# Patient Record
Sex: Female | Born: 1980 | Race: White | Hispanic: No | Marital: Married | Smoking: Never smoker
Health system: Southern US, Community
[De-identification: ages and names within clinical notes are randomized; demographics above are authoritative.]

## PROBLEM LIST (undated history)

## (undated) DIAGNOSIS — T4145XA Adverse effect of unspecified anesthetic, initial encounter: Secondary | ICD-10-CM

## (undated) DIAGNOSIS — F909 Attention-deficit hyperactivity disorder, unspecified type: Secondary | ICD-10-CM

## (undated) DIAGNOSIS — E039 Hypothyroidism, unspecified: Secondary | ICD-10-CM

## (undated) DIAGNOSIS — C73 Malignant neoplasm of thyroid gland: Secondary | ICD-10-CM

## (undated) DIAGNOSIS — IMO0001 Reserved for inherently not codable concepts without codable children: Secondary | ICD-10-CM

## (undated) DIAGNOSIS — Q219 Congenital malformation of cardiac septum, unspecified: Secondary | ICD-10-CM

## (undated) DIAGNOSIS — T8859XA Other complications of anesthesia, initial encounter: Secondary | ICD-10-CM

## (undated) DIAGNOSIS — O24419 Gestational diabetes mellitus in pregnancy, unspecified control: Secondary | ICD-10-CM

## (undated) DIAGNOSIS — M419 Scoliosis, unspecified: Secondary | ICD-10-CM

## (undated) HISTORY — DX: Malignant neoplasm of thyroid gland: C73

## (undated) HISTORY — DX: Attention-deficit hyperactivity disorder, unspecified type: F90.9

## (undated) HISTORY — DX: Hypothyroidism, unspecified: E03.9

## (undated) HISTORY — DX: Adverse effect of unspecified anesthetic, initial encounter: T41.45XA

## (undated) HISTORY — DX: Scoliosis, unspecified: M41.9

## (undated) HISTORY — DX: Reserved for inherently not codable concepts without codable children: IMO0001

## (undated) HISTORY — PX: THYROIDECTOMY: SHX17

## (undated) HISTORY — DX: Congenital malformation of cardiac septum, unspecified: Q21.9

## (undated) HISTORY — DX: Gestational diabetes mellitus in pregnancy, unspecified control: O24.419

## (undated) HISTORY — PX: WISDOM TOOTH EXTRACTION: SHX21

## (undated) HISTORY — DX: Other complications of anesthesia, initial encounter: T88.59XA

---

## 2009-03-11 ENCOUNTER — Inpatient Hospital Stay (HOSPITAL_COMMUNITY): Admission: AD | Admit: 2009-03-11 | Discharge: 2009-03-11 | Payer: Self-pay | Admitting: Obstetrics and Gynecology

## 2009-03-13 ENCOUNTER — Inpatient Hospital Stay (HOSPITAL_COMMUNITY): Admission: AD | Admit: 2009-03-13 | Discharge: 2009-03-16 | Payer: Self-pay | Admitting: Obstetrics and Gynecology

## 2009-10-04 ENCOUNTER — Ambulatory Visit: Payer: Self-pay | Admitting: Family Medicine

## 2009-10-04 DIAGNOSIS — E039 Hypothyroidism, unspecified: Secondary | ICD-10-CM | POA: Insufficient documentation

## 2011-04-11 LAB — CBC
HCT: 40.6 % (ref 36.0–46.0)
MCHC: 34 g/dL (ref 30.0–36.0)
MCV: 94 fL (ref 78.0–100.0)
Platelets: 142 10*3/uL — ABNORMAL LOW (ref 150–400)
RBC: 3.05 MIL/uL — ABNORMAL LOW (ref 3.87–5.11)
RDW: 13.5 % (ref 11.5–15.5)
RDW: 13.7 % (ref 11.5–15.5)

## 2011-04-11 LAB — RPR: RPR Ser Ql: NONREACTIVE

## 2011-05-14 NOTE — Discharge Summary (Signed)
NAMECHARLCIE, Kathryn Dunn              ACCOUNT NO.:  [id number]   MEDICAL RECORD NO.:  [id number]          PATIENT TYPE:  INP   LOCATION:  9144                          FACILITY:  [facility]   PHYSICIAN:  Huel Cote, M.D. DATE OF BIRTH:  Sep 15, 1981   DATE OF ADMISSION:  03/13/2009  DATE OF DISCHARGE:  03/16/2009                               DISCHARGE SUMMARY   DISCHARGE DIAGNOSES:  1. Term pregnancy at 39-6/7th weeks' delivered.  2. Status post vacuum-assisted vaginal delivery.  3. Severe shoulder dystocia.   DISCHARGE MEDICATIONS:  1. Motrin 600 mg p.o. q.6 h.  2. Percocet 1-2 tablets p.o. q.4 h. p.r.n.   DISCHARGE FOLLOWUP:  The patient is to follow up in my office in 6 weeks  for her routine postpartum exam.   HOSPITAL COURSE:  The patient is a 30 year old, G1, P0, who was admitted  at 39-6/7th weeks' gestation with regular and painful contractions of  increasing intensity and cervical change to 2-3 cm.  Prenatal care was  uncomplicated overall.   PAST MEDICAL HISTORY:  Significant for thyroidectomy secondary to  thyroid cancer, ADHD, and hypoglycemia.   PAST SURGICAL HISTORY:  The thyroidectomy only.   PAST OBSTETRICAL HISTORY:  None.   PAST GYNECOLOGICAL HISTORY:  None.   SIGNIFICANT MEDICATIONS:  Synthroid, prenatal vitamins, and Atarax.   PRENATAL LABORATORY DATA:  A positive, antibody negative, RPR  nonreactive, rubella immune, hepatitis B surface antigen negative, HIV  negative, GC negative, and chlamydia negative.  One-hour Glucola was  elevated.  Three-hour Glucola was within normal limits.  The patient was  admitted with a reactive fetal heart rate.  Cervix was as stated 2-3 cm,  90, and -2 station.  She had rupture of membranes performed with clear  fluid noted and received an epidural.  She was augmented with Pitocin  and an IUPC placed to help adjust that.  She then continued to progress  well, reaching complete dilation at approximately 9:20 a.m. and began  pushing.  The patient pushed with several short breaks for approximately  2-1/2 hours and was making some progress, but visibly slowing down with  increasing fatigue.  We discussed options.  At this point, the vertex  with a +2-+3 station with head visible at the introitus and LOA in  presentation.  We discussed all options and the patient was agreeable to  a trial of vacuum-assisted vaginal delivery.  She had a vacuum placed at  a +2-+3 station and over 2 contractions the vertex was delivered to the  outlet.  The patient was allowed to continue to push without the vacuum  for about 10 minutes and could not get the head ultimately to crowning.  Therefore, the vacuum was reapplied, and head was delivered with 1  additional contraction.  There was a turtle sign noted to the NICU and  other nurses will call to the room immediately.  The severe shoulder  dystocia was encountered with McRoberts suprapubic and Woods-Screw  maneuver ineffective.  A posterior arm release was then performed of the  right arm and then the anterior left shoulder was delivered without  significant  traction.  Cord was clamped and the baby was handed to the  pediatrician slightly floppy, but with a good heart rate.  Apgars were 6  and 8, weight was 9 pounds and 9 ounces of a viable female infant.  After delivery, the baby responded very quickly to stimulation and was  moving both arms, though the left was slightly less than the right.  Placenta delivered spontaneously.  A third-degree laceration was noted.  The rectal sphincter was repaired in an overlapping fashion with several  figure-of-eight 2-0 Vicryl sutures and the remainder of the repair was  done with 3-0 Vicryl Rapide.  Cervix and rectum were intact.  Methergine  IM x1 was given for atony and total estimated blood loss was 500 mL.  Postpartum, the patient did well.  Her hemoglobin after delivery was  6.9.  Pain was controlled with p.o. medications and she was  advised on  stool softeners and placed on those postpartum.  She will continue on  with her pain medications, stool softeners, and be aggressive about her  bowel regimen to ensure no significant constipation.  Fundus was firm  and bleeding normal on discharge, and she was to follow up in the office  in 6 weeks.      Huel Cote, M.D.  Electronically Signed     KR/MEDQ  D:  03/16/2009  T:  03/16/2009  Job:  [id number]

## 2011-12-31 NOTE — L&D Delivery Note (Signed)
Delivery Note At 1:32 PM a viable female was delivered via Vaginal, Spontaneous Delivery (Presentation: Left Occiput Anterior).  APGAR: 9, 9; weight 7#7oz .   Placenta status: Intact, Spontaneous.  Cord: 3 vessels with the following complications: None.  Anesthesia: Epidural  Episiotomy: None Lacerations: 2nd degree Suture Repair: 3.0 vicryl rapide Est. Blood Loss (mL): 400  Mom to postpartum.  Baby to stay with mom in room.  Kathryn Dunn 08/04/2012, 2:21 PM

## 2012-01-01 LAB — OB RESULTS CONSOLE GC/CHLAMYDIA: Gonorrhea: NEGATIVE

## 2012-01-01 LAB — OB RESULTS CONSOLE ANTIBODY SCREEN: Antibody Screen: NEGATIVE

## 2012-01-01 LAB — OB RESULTS CONSOLE ABO/RH

## 2012-07-17 LAB — OB RESULTS CONSOLE GBS: GBS: NEGATIVE

## 2012-07-27 ENCOUNTER — Encounter (HOSPITAL_COMMUNITY): Payer: Self-pay | Admitting: *Deleted

## 2012-07-27 ENCOUNTER — Telehealth (HOSPITAL_COMMUNITY): Payer: Self-pay | Admitting: *Deleted

## 2012-07-27 NOTE — Telephone Encounter (Signed)
Preadmission screen  

## 2012-08-04 ENCOUNTER — Inpatient Hospital Stay (HOSPITAL_COMMUNITY)
Admission: AD | Admit: 2012-08-04 | Discharge: 2012-08-06 | DRG: 372 | Disposition: A | Discharge status: Home / Self Care | Payer: BC Managed Care – PPO | Source: Ambulatory Visit | Attending: Obstetrics and Gynecology | Admitting: Obstetrics and Gynecology

## 2012-08-04 ENCOUNTER — Encounter (HOSPITAL_COMMUNITY): Payer: Self-pay | Admitting: Anesthesiology

## 2012-08-04 ENCOUNTER — Inpatient Hospital Stay (HOSPITAL_COMMUNITY): Payer: BC Managed Care – PPO | Admitting: Anesthesiology

## 2012-08-04 ENCOUNTER — Encounter (HOSPITAL_COMMUNITY): Payer: Self-pay | Admitting: *Deleted

## 2012-08-04 DIAGNOSIS — O4290 Premature rupture of membranes, unspecified as to length of time between rupture and onset of labor, unspecified weeks of gestation: Principal | ICD-10-CM | POA: Diagnosis present

## 2012-08-04 LAB — CBC
Platelets: 164 10*3/uL (ref 150–400)
RBC: 4.17 MIL/uL (ref 3.87–5.11)
WBC: 10.6 10*3/uL — ABNORMAL HIGH (ref 4.0–10.5)

## 2012-08-04 LAB — RPR: RPR Ser Ql: NONREACTIVE

## 2012-08-04 MED ORDER — OXYCODONE-ACETAMINOPHEN 5-325 MG PO TABS: 1.0000 | ORAL_TABLET | ORAL | Status: DC | PRN

## 2012-08-04 MED ORDER — ZOLPIDEM TARTRATE 5 MG PO TABS: 5.0000 mg | ORAL_TABLET | Freq: Every evening | ORAL | Status: DC | PRN

## 2012-08-04 MED ORDER — EPHEDRINE 5 MG/ML INJ
10.0000 mg | INTRAVENOUS | Status: DC | PRN
  Filled 2012-08-04: qty 4

## 2012-08-04 MED ORDER — LIDOCAINE HCL (PF) 1 % IJ SOLN
INTRAMUSCULAR | Status: DC | PRN
  Administered 2012-08-04 (×2): 4 mL

## 2012-08-04 MED ORDER — DIPHENHYDRAMINE HCL 25 MG PO CAPS: 25.0000 mg | ORAL_CAPSULE | Freq: Four times a day (QID) | ORAL | Status: DC | PRN

## 2012-08-04 MED ORDER — IBUPROFEN 600 MG PO TABS: 600.0000 mg | ORAL_TABLET | Freq: Four times a day (QID) | ORAL | Status: DC | PRN

## 2012-08-04 MED ORDER — BENZOCAINE-MENTHOL 20-0.5 % EX AERO
1.0000 "application " | INHALATION_SPRAY | CUTANEOUS | Status: DC | PRN
  Administered 2012-08-04: 1 via TOPICAL
  Filled 2012-08-04: qty 56

## 2012-08-04 MED ORDER — PRENATAL MULTIVITAMIN CH
1.0000 | ORAL_TABLET | Freq: Every day | ORAL | Status: DC
  Administered 2012-08-04 – 2012-08-06 (×2): 1 via ORAL
  Filled 2012-08-04 (×3): qty 1

## 2012-08-04 MED ORDER — SENNOSIDES-DOCUSATE SODIUM 8.6-50 MG PO TABS
2.0000 | ORAL_TABLET | Freq: Every day | ORAL | Status: DC
  Administered 2012-08-04 – 2012-08-05 (×2): 2 via ORAL

## 2012-08-04 MED ORDER — LACTATED RINGERS IV SOLN: 500.0000 mL | Freq: Once | INTRAVENOUS | Status: DC

## 2012-08-04 MED ORDER — LIDOCAINE HCL (PF) 1 % IJ SOLN
30.0000 mL | INTRAMUSCULAR | Status: DC | PRN
  Filled 2012-08-04: qty 30

## 2012-08-04 MED ORDER — OXYTOCIN BOLUS FROM INFUSION
250.0000 mL | Freq: Once | INTRAVENOUS | Status: DC
  Filled 2012-08-04: qty 500

## 2012-08-04 MED ORDER — ONDANSETRON HCL 4 MG/2ML IJ SOLN: 4.0000 mg | Freq: Four times a day (QID) | INTRAMUSCULAR | Status: DC | PRN

## 2012-08-04 MED ORDER — OXYCODONE-ACETAMINOPHEN 5-325 MG PO TABS
1.0000 | ORAL_TABLET | ORAL | Status: DC | PRN
  Administered 2012-08-05 – 2012-08-06 (×6): 1 via ORAL
  Filled 2012-08-04 (×6): qty 1

## 2012-08-04 MED ORDER — DIBUCAINE 1 % RE OINT: 1.0000 "application " | TOPICAL_OINTMENT | RECTAL | Status: DC | PRN

## 2012-08-04 MED ORDER — LACTATED RINGERS IV SOLN
INTRAVENOUS | Status: DC
  Administered 2012-08-04: 05:00:00 via INTRAVENOUS

## 2012-08-04 MED ORDER — OXYTOCIN 40 UNITS IN LACTATED RINGERS INFUSION - SIMPLE MED
1.0000 m[IU]/min | INTRAVENOUS | Status: DC
  Administered 2012-08-04: 1 m[IU]/min via INTRAVENOUS

## 2012-08-04 MED ORDER — IBUPROFEN 600 MG PO TABS
600.0000 mg | ORAL_TABLET | Freq: Four times a day (QID) | ORAL | Status: DC
  Administered 2012-08-04 – 2012-08-06 (×8): 600 mg via ORAL
  Filled 2012-08-04 (×8): qty 1

## 2012-08-04 MED ORDER — ACETAMINOPHEN 325 MG PO TABS: 650.0000 mg | ORAL_TABLET | ORAL | Status: DC | PRN

## 2012-08-04 MED ORDER — ONDANSETRON HCL 4 MG/2ML IJ SOLN: 4.0000 mg | INTRAMUSCULAR | Status: DC | PRN

## 2012-08-04 MED ORDER — EPHEDRINE 5 MG/ML INJ: 10.0000 mg | INTRAVENOUS | Status: DC | PRN

## 2012-08-04 MED ORDER — LANOLIN HYDROUS EX OINT: TOPICAL_OINTMENT | CUTANEOUS | Status: DC | PRN

## 2012-08-04 MED ORDER — PHENYLEPHRINE 40 MCG/ML (10ML) SYRINGE FOR IV PUSH (FOR BLOOD PRESSURE SUPPORT)
80.0000 ug | PREFILLED_SYRINGE | INTRAVENOUS | Status: DC | PRN
  Filled 2012-08-04: qty 5

## 2012-08-04 MED ORDER — TERBUTALINE SULFATE 1 MG/ML IJ SOLN: 0.2500 mg | Freq: Once | INTRAMUSCULAR | Status: DC | PRN

## 2012-08-04 MED ORDER — CITRIC ACID-SODIUM CITRATE 334-500 MG/5ML PO SOLN: 30.0000 mL | ORAL | Status: DC | PRN

## 2012-08-04 MED ORDER — WITCH HAZEL-GLYCERIN EX PADS: 1.0000 "application " | MEDICATED_PAD | CUTANEOUS | Status: DC | PRN

## 2012-08-04 MED ORDER — DIPHENHYDRAMINE HCL 50 MG/ML IJ SOLN: 12.5000 mg | INTRAMUSCULAR | Status: DC | PRN

## 2012-08-04 MED ORDER — FENTANYL 2.5 MCG/ML BUPIVACAINE 1/10 % EPIDURAL INFUSION (WH - ANES)
INTRAMUSCULAR | Status: DC | PRN
  Administered 2012-08-04: 14 mL/h via EPIDURAL

## 2012-08-04 MED ORDER — LACTATED RINGERS IV SOLN
500.0000 mL | INTRAVENOUS | Status: DC | PRN
  Administered 2012-08-04: 1000 mL via INTRAVENOUS

## 2012-08-04 MED ORDER — PHENYLEPHRINE 40 MCG/ML (10ML) SYRINGE FOR IV PUSH (FOR BLOOD PRESSURE SUPPORT): 80.0000 ug | PREFILLED_SYRINGE | INTRAVENOUS | Status: DC | PRN

## 2012-08-04 MED ORDER — TETANUS-DIPHTH-ACELL PERTUSSIS 5-2.5-18.5 LF-MCG/0.5 IM SUSP
0.5000 mL | Freq: Once | INTRAMUSCULAR | Status: AC
  Administered 2012-08-05: 0.5 mL via INTRAMUSCULAR
  Filled 2012-08-04: qty 0.5

## 2012-08-04 MED ORDER — FENTANYL 2.5 MCG/ML BUPIVACAINE 1/10 % EPIDURAL INFUSION (WH - ANES)
14.0000 mL/h | INTRAMUSCULAR | Status: DC
  Filled 2012-08-04: qty 60

## 2012-08-04 MED ORDER — ONDANSETRON HCL 4 MG PO TABS: 4.0000 mg | ORAL_TABLET | ORAL | Status: DC | PRN

## 2012-08-04 MED ORDER — SIMETHICONE 80 MG PO CHEW: 80.0000 mg | CHEWABLE_TABLET | ORAL | Status: DC | PRN

## 2012-08-04 MED ORDER — FLEET ENEMA 7-19 GM/118ML RE ENEM: 1.0000 | ENEMA | RECTAL | Status: DC | PRN

## 2012-08-04 MED ORDER — OXYTOCIN 40 UNITS IN LACTATED RINGERS INFUSION - SIMPLE MED
62.5000 mL/h | Freq: Once | INTRAVENOUS | Status: DC
  Filled 2012-08-04: qty 1000

## 2012-08-04 MED ORDER — LEVOTHYROXINE SODIUM 150 MCG PO TABS
150.0000 ug | ORAL_TABLET | Freq: Every day | ORAL | Status: DC
  Administered 2012-08-05 – 2012-08-06 (×2): 150 ug via ORAL
  Filled 2012-08-04 (×3): qty 1

## 2012-08-04 NOTE — MAU Note (Signed)
Pt reports ROM at 0230, clear fluid. Some contractions

## 2012-08-04 NOTE — Anesthesia Procedure Notes (Signed)
Epidural Patient location during procedure: OB Start time: 08/04/2012 11:05 AM  Staffing Anesthesiologist: Laxmi Choung A. Performed by: anesthesiologist   Preanesthetic Checklist Completed: patient identified, site marked, surgical consent, pre-op evaluation, timeout performed, IV checked, risks and benefits discussed and monitors and equipment checked  Epidural Patient position: sitting Prep: site prepped and draped and DuraPrep Patient monitoring: continuous pulse ox and blood pressure Approach: midline Injection technique: LOR air  Needle:  Needle type: Tuohy  Needle gauge: 17 G Needle length: 9 cm Needle insertion depth: 5 cm cm Catheter type: closed end flexible Catheter size: 19 Gauge Catheter at skin depth: 10 cm Test dose: negative and Other  Assessment Events: blood not aspirated, injection not painful, no injection resistance, negative IV test and no paresthesia  Additional Notes Patient identified. Risks and benefits discussed including failed block, incomplete  Pain control, post dural puncture headache, nerve damage, paralysis, blood pressure Changes, nausea, vomiting, reactions to medications-both toxic and allergic and post Partum back pain. All questions were answered. Patient expressed understanding and wished to proceed. Sterile technique was used throughout procedure. Epidural site was Dressed with sterile barrier dressing. No paresthesias, signs of intravascular injection Or signs of intrathecal spread were encountered.  Patient was more comfortable after the epidural was dosed. Please see RN's note for documentation of vital signs and FHR which are stable.

## 2012-08-04 NOTE — Progress Notes (Signed)
Pt up to bathroom via stedy. After voiding pt states she was feeling dizzy.  Pulled emergency cord.  Help to room as pt was fainting.  Easily aroused with ammonia.  Pt brought back to bed via stedy.  Vital signs 117/76, 67.  Alert, oriented x4.  Lochia small with no clots.  Uterus 2 below and firm.  Husband at bedside.  Pt eating crackers and drinking apple juice.  Will continue to monitor closely

## 2012-08-04 NOTE — Progress Notes (Signed)
   Subjective: Pt admitted with SROM, not feeling significant contractions  Objective: BP 105/64  Pulse 71  Temp 98.3 F (36.8 C) (Oral)  Resp 18  Ht 5\' 5"  (1.651 m)  Wt 81.194 kg (179 lb)  BMI 29.79 kg/m2  SpO2 99%  LMP 11/06/2011      FHT:  FHR: 130 bpm, variability: moderate,  accelerations:  Present,  decelerations:  Absent UC:   regular, every 3-5 minutes SVE:   Dilation: 3 Effacement (%): 60 Station: -2 Exam by:: Dr. Senaida Ores No siginificant forebag noted  Labs: Lab Results  Component Value Date   WBC 10.6* 08/04/2012   HGB 13.1 08/04/2012   HCT 38.0 08/04/2012   MCV 91.1 08/04/2012   PLT 164 08/04/2012    Assessment / Plan: SROM with no labor, will start pitocin Kathryn Dunn 08/04/2012, 8:59 AM

## 2012-08-04 NOTE — Progress Notes (Signed)
Pt. To room 113 via wheelchair.  Report given to [facility]

## 2012-08-04 NOTE — H&P (Signed)
NAMEGALEN, Kathryn Dunn              ACCOUNT NO.:  [id number]  MEDICAL RECORD NO.:  [id number]  LOCATION:  9168                          FACILITY:  [facility]  PHYSICIAN:  Malachi Pro. Ambrose Mantle, M.D. DATE OF BIRTH:  1981-01-04  DATE OF ADMISSION:  08/04/2012 DATE OF DISCHARGE:                             HISTORY & PHYSICAL   PRESENT ILLNESS:  This is a 31 year old white female, para 1-0-0-1, gravida 2, EDC August 12, 2012, who is admitted with premature rupture of the membranes.  Blood group and type A positive, negative antibody. Pap smear normal.  Rubella immune.  RPR nonreactive.  Urine culture negative.  Hepatitis B surface antigen negative, HIV negative.  GC and Chlamydia negative.  First trimester screen negative.  Cystic fibrosis screen declined.  AFP negative.  Early 1 hour Glucola 123.  Later 1 hour Glucola 176, 3 hour GTT 81, 178, 115, and 99.  Group B strep negative. This patient had a relatively benign prenatal course.  She was scheduled for induction in 3 days.  At 2:00 a.m. today, she had ruptured her membranes, came to the hospital, was contracting without pain and was admitted to the hospital.  She had an early ultrasound at 7 weeks and 4 days on December 27, 2011, that gave her a due date of August 10, 2012. By dates, she had a due date of August 12, 2012, a repeat ultrasound on March 19, 2012, average gestational age was 20 weeks 1 day with an [facility] of August 05, 2012.  No abnormalities were noted.  PAST MEDICAL HISTORY:  Reveals no known allergies.  She did have history of ADHD.  She has had a history of gestational diabetes.  She had acquired hypothyroidism.  She did have thyroid cancer in 2005.  She has had PUPPS, scoliosis.  SURGICAL HISTORY:  Total thyroidectomy in 2005, wisdom teeth extracted 2011.  MEDICATIONS:  Prenatal vitamins and Synthroid 137 mcg a day.  FAMILY MEDICAL HISTORY:  Father and brother with ADHD maternal grandmother with breast cancer.  Father with high  blood pressure, and paternal grandfather with hypertension, paternal grandmother with kidney disease.  The patient denies tobacco, alcohol, and illicit substance abuse.  She has a Education administrator degree in special education from [employer].  OBSTETRIC HISTORY:  The patient had 9 pounds 9 ounce female infant, delivered in 2010 by Dr. Senaida Ores by vacuum extraction.  There was shoulder dystocia, and third-degree laceration.  PHYSICAL EXAM:  VITAL SIGNS:  Blood pressure is 110/67, pulse is 66, respirations 20, temperature 98.3. HEART:  Normal size and sounds.  No murmurs.  LUNGS:  Clear to auscultation. ABDOMEN:  Soft and not tender.  Contractions are coming every 3 minutes. The fundal height at her last prenatal visit on July 27, 2012, was 38 cm.  Fetal heart tones are normal with good acceleration and no deceleration.  Per the admitting nurse the cervix is 3 cm.  Rupture of membranes was confirmed.  ADMITTING IMPRESSION:  Intrauterine pregnancy at 38 weeks and 6 days, with premature rupture of the membranes.  The patient is admitted. Decision on Pitocin augmentation will be based on whether this progress without Pitocin.     Malachi Pro.  Ambrose Mantle, M.D.     TFH/MEDQ  D:  08/04/2012  T:  08/04/2012  Job:  [id number]

## 2012-08-04 NOTE — Anesthesia Preprocedure Evaluation (Signed)
Anesthesia Evaluation    Airway Mallampati: II TM Distance: >3 FB Neck ROM: Full    Dental No notable dental hx. (+) Teeth Intact   Pulmonary neg pulmonary ROS,  breath sounds clear to auscultation  Pulmonary exam normal       Cardiovascular Rhythm:Regular Rate:Normal  Hx/o ASD   Neuro/Psych PSYCHIATRIC DISORDERS ADHDnegative neurological ROS     GI/Hepatic negative GI ROS, Neg liver ROS,   Endo/Other  Well Controlled, GestationalHypothyroidism   Renal/GU negative Renal ROS  negative genitourinary   Musculoskeletal negative musculoskeletal ROS (+)   Abdominal   Peds  Hematology negative hematology ROS (+)   Anesthesia Other Findings   Reproductive/Obstetrics (+) Pregnancy                           Anesthesia Physical Anesthesia Plan  ASA: II  Anesthesia Plan: Epidural   Post-op Pain Management:    Induction:   Airway Management Planned: Natural Airway  Additional Equipment:   Intra-op Plan:   Post-operative Plan:   Informed Consent: I have reviewed the patients History and Physical, chart, labs and discussed the procedure including the risks, benefits and alternatives for the proposed anesthesia with the patient or authorized representative who has indicated his/her understanding and acceptance.   Dental advisory given  Plan Discussed with: Anesthesiologist  Anesthesia Plan Comments:         Anesthesia Quick Evaluation

## 2012-08-05 LAB — CBC
HCT: 36.9 % (ref 36.0–46.0)
Hemoglobin: 12.5 g/dL (ref 12.0–15.0)
MCHC: 33.9 g/dL (ref 30.0–36.0)
RBC: 3.99 MIL/uL (ref 3.87–5.11)
WBC: 10.2 10*3/uL (ref 4.0–10.5)

## 2012-08-05 NOTE — Anesthesia Postprocedure Evaluation (Signed)
  Anesthesia Post-op Note  Patient: Kathryn Dunn  Procedure(s) Performed: * No procedures listed *  Patient Location: PACU and Mother/Baby  Anesthesia Type: Epidural  Level of Consciousness: awake, alert  and oriented  Airway and Oxygen Therapy: Patient Spontanous Breathing  Post-op Pain: none  Post-op Assessment: Post-op Vital signs reviewed  Post-op Vital Signs: Reviewed and stable  Complications: No apparent anesthesia complications

## 2012-08-05 NOTE — Progress Notes (Signed)
Post Partum Day 1 Subjective: no complaints, up ad lib and tolerating PO  Objective: Blood pressure 109/74, pulse 64, temperature 98.1 F (36.7 C), temperature source Oral, resp. rate 16, height 5\' 5"  (1.651 m), weight 81.194 kg (179 lb), last menstrual period 11/06/2011, SpO2 98.00%, unknown if currently breastfeeding.  Physical Exam:  General: alert and cooperative Lochia: appropriate Uterine Fundus: firm    Basename 08/05/12 0600 08/04/12 0510  HGB 12.5 13.1  HCT 36.9 38.0    Assessment/Plan: Plan for discharge tomorrow D/w pt and husband circumcision risks and benefits, desires to proceed.   LOS: 1 day   Kaytlin Burklow W 08/05/2012, 6:28 AM

## 2012-08-06 MED ORDER — OXYCODONE-ACETAMINOPHEN 5-325 MG PO TABS: 1.0000 | ORAL_TABLET | ORAL | Status: AC | PRN

## 2012-08-06 MED ORDER — IBUPROFEN 600 MG PO TABS: 600.0000 mg | ORAL_TABLET | Freq: Four times a day (QID) | ORAL | Status: AC

## 2012-08-06 NOTE — Discharge Summary (Signed)
Obstetric Discharge Summary Reason for Admission: induction of labor Prenatal Procedures: none Intrapartum Procedures: spontaneous vaginal delivery Postpartum Procedures: none Complications-Operative and Postpartum: second degree perineal laceration Hemoglobin  Date Value Range Status  08/05/2012 12.5  12.0 - 15.0 g/dL Final     HCT  Date Value Range Status  08/05/2012 36.9  36.0 - 46.0 % Final    Physical Exam:  General: alert and cooperative Lochia: appropriate Uterine Fundus: firm   Discharge Diagnoses: Term Pregnancy-delivered  Discharge Information: Date: 08/06/2012 Activity: pelvic rest Diet: routine Medications: Ibuprofen and Percocet Condition: improved Instructions: refer to practice specific booklet Discharge to: home   Newborn Data: Live born female  Birth Weight: 7 lb 7 oz (3374 g) APGAR: 9, 9  Home with mother.  Kathryn Dunn 08/06/2012, 8:49 AM

## 2012-08-06 NOTE — Progress Notes (Signed)
Post Partum Day 2 Subjective: no complaints, up ad lib and tolerating PO  Objective: Blood pressure 93/60, pulse 64, temperature 97.7 F (36.5 C), temperature source Oral, resp. rate 18, height 5\' 5"  (1.651 m), weight 81.194 kg (179 lb), last menstrual period 11/06/2011, SpO2 93.00%, unknown if currently breastfeeding.  Physical Exam:  General: alert and cooperative Lochia: appropriate Uterine Fundus: firm    Basename 08/05/12 0600 08/04/12 0510  HGB 12.5 13.1  HCT 36.9 38.0    Assessment/Plan: Discharge home Motrin, percocet F/u 6 weeks   LOS: 2 days   Sylva Overley W 08/06/2012, 8:48 AM

## 2012-08-07 ENCOUNTER — Inpatient Hospital Stay (HOSPITAL_COMMUNITY): Admission: RE | Admit: 2012-08-07 | Payer: BC Managed Care – PPO | Source: Ambulatory Visit

## 2013-04-25 ENCOUNTER — Encounter: Payer: Self-pay | Admitting: Emergency Medicine

## 2013-04-25 ENCOUNTER — Emergency Department (INDEPENDENT_AMBULATORY_CARE_PROVIDER_SITE_OTHER)
Admission: EM | Admit: 2013-04-25 | Discharge: 2013-04-25 | Disposition: A | Discharge status: Home / Self Care | Payer: BC Managed Care – PPO | Source: Home / Self Care | Attending: Emergency Medicine | Admitting: Emergency Medicine

## 2013-04-25 DIAGNOSIS — J0100 Acute maxillary sinusitis, unspecified: Secondary | ICD-10-CM

## 2013-04-25 MED ORDER — AMOXICILLIN 875 MG PO TABS: 875.0000 mg | ORAL_TABLET | Freq: Two times a day (BID) | ORAL | Status: DC

## 2013-04-25 NOTE — ED Provider Notes (Signed)
History     CSN: [id number]  Arrival date & time 04/25/13  1616   First MD Initiated Contact with Patient 04/25/13 1645      Chief Complaint  Patient presents with  . Nasal Congestion    (Consider location/radiation/quality/duration/timing/severity/associated sxs/prior treatment) HPI Kathryn Dunn is a 32 y.o. female who complains of onset of cold symptoms for 5 days.  The symptoms are constant and mild-moderate in severity. No sore throat + cough No pleuritic pain No wheezing + nasal congestion + post-nasal drainage ++ sinus pain/pressure (main symptom) No chest congestion No itchy/red eyes No earache No hemoptysis No chills/sweats No fever No nausea No vomiting No abdominal pain No diarrhea No skin rashes No fatigue No myalgias + headache    Past Medical History  Diagnosis Date  . ADHD (attention deficit hyperactivity disorder)   . Gestational diabetes     diet controlled  . 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 Thyroid cancer     thyroid removed  . Hypothyroidism   . Scoliosis   . Complication of anesthesia     vomitting during surgery  . Patient is a currently breast-feeding mother     Past Surgical History  Procedure Laterality Date  . Thyroidectomy    . Wisdom tooth extraction    . Thyroidectomy      Family History  Problem Relation Age of Onset  . ADD / ADHD Father   . Hypertension Father   . ADD / ADHD Brother   . Thyroid cancer Maternal Uncle   . Cancer Maternal Grandmother     breast  . Kidney disease Paternal Grandmother   . Hypertension Paternal Grandfather   . Cancer Cousin     colon  . Mental retardation Cousin     History  Substance Use Topics  . Smoking status: Never Smoker   . Smokeless tobacco: Never Used  . Alcohol Use: No    OB History   Grav Para Term Preterm Abortions TAB SAB Ect Mult Living   2 2 2  0 0 0 0 0 0 2      Review of Systems  All other systems reviewed and are  negative.    Allergies  Review of patient's allergies indicates no known allergies.  Home Medications   Current Outpatient Rx  Name  Route  Sig  Dispense  Refill  . amoxicillin (AMOXIL) 875 MG tablet   Oral   Take 1 tablet (875 mg total) by mouth 2 (two) times daily.   14 tablet   0   . docusate sodium (COLACE) 100 MG capsule   Oral   Take 100 mg by mouth daily.         Marland Kitchen levothyroxine (SYNTHROID, LEVOTHROID) 150 MCG tablet   Oral   Take 150 mcg by mouth daily.         . Prenatal Vit-Fe Fumarate-FA (PRENATAL MULTIVITAMIN) TABS   Oral   Take 1 tablet by mouth daily.           BP 100/67  Pulse 90  Temp(Src) 99.6 F (37.6 C) (Oral)  Ht 5\' 5"  (1.651 m)  Wt 140 lb (63.504 kg)  BMI 23.3 kg/m2  SpO2 97%  Physical Exam  Nursing note and vitals reviewed. Constitutional: She is oriented to person, place, and time. She appears well-developed and well-nourished.  HENT:  Head: Normocephalic and atraumatic.  Right Ear: Tympanic membrane, external ear and ear canal normal.  Left Ear: Tympanic membrane,  external ear and ear canal normal.  Nose: Mucosal edema and rhinorrhea present. Right sinus exhibits maxillary sinus tenderness. Left sinus exhibits maxillary sinus tenderness.  Mouth/Throat: Posterior oropharyngeal erythema present. No oropharyngeal exudate or posterior oropharyngeal edema.  Eyes: No scleral icterus.  Neck: Neck supple.  Cardiovascular: Regular rhythm and normal heart sounds.   Pulmonary/Chest: Effort normal and breath sounds normal. No respiratory distress.  Neurological: She is alert and oriented to person, place, and time.  Skin: Skin is warm and dry.  Psychiatric: She has a normal mood and affect. Her speech is normal.    ED Course  Procedures (including critical care time)  Labs Reviewed - No data to display No results found.   1. Acute maxillary sinusitis       MDM  1)  Take the prescribed antibiotic as instructed. 2)  Use nasal  saline solution (over the counter) at least 3 times a day. 3)  Use over the counter decongestants like Zyrtec-D every 12 hours as needed to help with congestion.  If you have hypertension, do not take medicines with sudafed.  4)  Can take tylenol every 6 hours or motrin every 8 hours for pain or fever. 5)  Follow up with your primary doctor if no improvement in 5-7 days, sooner if increasing pain, fever, or new symptoms.     Marlaine Hind, MD 04/25/13 1700

## 2013-04-25 NOTE — ED Notes (Signed)
Reports congestion with cough and headache x 5 days. Took Advil at 15:30 today.

## 2014-10-31 ENCOUNTER — Encounter: Payer: Self-pay | Admitting: Emergency Medicine

## 2015-01-12 ENCOUNTER — Encounter: Payer: Self-pay | Admitting: Emergency Medicine

## 2015-01-12 ENCOUNTER — Emergency Department
Admission: EM | Admit: 2015-01-12 | Discharge: 2015-01-12 | Disposition: A | Discharge status: Home / Self Care | Payer: BC Managed Care – PPO | Source: Home / Self Care | Attending: Emergency Medicine | Admitting: Emergency Medicine

## 2015-01-12 DIAGNOSIS — M7032 Other bursitis of elbow, left elbow: Secondary | ICD-10-CM

## 2015-01-12 DIAGNOSIS — M7022 Olecranon bursitis, left elbow: Secondary | ICD-10-CM

## 2015-01-12 MED ORDER — HYDROCODONE-ACETAMINOPHEN 5-325 MG PO TABS: 2.0000 | ORAL_TABLET | ORAL | Status: DC | PRN

## 2015-01-12 MED ORDER — IBUPROFEN 800 MG PO TABS: 800.0000 mg | ORAL_TABLET | Freq: Three times a day (TID) | ORAL | Status: DC

## 2015-01-12 NOTE — Discharge Instructions (Signed)
Bursitis °Bursitis is a swelling and soreness (inflammation) of a fluid-filled sac (bursa) that overlies and protects a joint. It can be caused by injury, overuse of the joint, arthritis or infection. The joints most likely to be affected are the elbows, shoulders, hips and knees. °HOME CARE INSTRUCTIONS  °· Apply ice to the affected area for 15-20 minutes each hour while awake for 2 days. Put the ice in a plastic bag and place a towel between the bag of ice and your skin. °· Rest the injured joint as much as possible, but continue to put the joint through a full range of motion, 4 times per day. (The shoulder joint especially becomes rapidly "frozen" if not used.) When the pain lessens, begin normal slow movements and usual activities. °· Only take over-the-counter or prescription medicines for pain, discomfort or fever as directed by your caregiver. °· Your caregiver may recommend draining the bursa and injecting medicine into the bursa. This may help the healing process. °· Follow all instructions for follow-up with your caregiver. This includes any orthopedic referrals, physical therapy and rehabilitation. Any delay in obtaining necessary care could result in a delay or failure of the bursitis to heal and chronic pain. °SEEK IMMEDIATE MEDICAL CARE IF:  °· Your pain increases even during treatment. °· You develop an oral temperature above 102° F (38.9° C) and have heat and inflammation over the involved bursa. °MAKE SURE YOU:  °· Understand these instructions. °· Will watch your condition. °· Will get help right away if you are not doing well or get worse. °Document Released: 12/13/2000 Document Revised: 03/09/2012 Document Reviewed: 03/07/2014 °[employer]® Patient Information ©2015 [employer]. This information is not intended to replace advice given to you by your health care provider. Make sure you discuss any questions you have with your health care provider. ° °

## 2015-01-12 NOTE — ED Provider Notes (Signed)
CSN: [id number]     Arrival date & time 01/12/15  0806 History   First MD Initiated Contact with Patient 01/12/15 [phone]     Chief Complaint  Patient presents with  . Elbow Pain   (Consider location/radiation/quality/duration/timing/severity/associated sxs/prior Treatment) Patient is a 34 y.o. female presenting with arm injury. The history is provided by the patient. No language interpreter was used.  Arm Injury Location:  Elbow Injury: no   Elbow location:  L elbow Pain details:    Quality:  Aching   Radiates to:  Does not radiate   Severity:  Moderate   Onset quality:  Gradual   Duration:  1 day   Timing:  Constant   Progression:  Worsening Chronicity:  New Prior injury to area:  Yes (years ago) Worsened by:  Movement Ineffective treatments:  None tried Associated symptoms: no fever     Past Medical History  Diagnosis Date  . ADHD (attention deficit hyperactivity disorder)   . Gestational diabetes     diet controlled  . 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 Thyroid cancer     thyroid removed  . Hypothyroidism   . Scoliosis   . Complication of anesthesia     vomitting during surgery  . Patient is a currently breast-feeding mother    Past Surgical History  Procedure Laterality Date  . Thyroidectomy    . Wisdom tooth extraction    . Thyroidectomy     Family History  Problem Relation Age of Onset  . ADD / ADHD Father   . Hypertension Father   . ADD / ADHD Brother   . Thyroid cancer Maternal Uncle   . Cancer Maternal Grandmother     breast  . Kidney disease Paternal Grandmother   . Hypertension Paternal Grandfather   . Cancer Cousin     colon  . Mental retardation Cousin    History  Substance Use Topics  . Smoking status: Never Smoker   . Smokeless tobacco: Never Used  . Alcohol Use: No   OB History    Gravida Para Term Preterm AB TAB SAB Ectopic Multiple Living   2 2 2  0 0 0 0 0 0 2     Review of Systems  Constitutional:  Negative for fever.  Musculoskeletal: Positive for myalgias.  All other systems reviewed and are negative.   Allergies  Review of patient's allergies indicates no known allergies.  Home Medications   Prior to Admission medications   Medication Sig Start Date End Date Taking? Authorizing Provider  amoxicillin (AMOXIL) 875 MG tablet Take 1 tablet (875 mg total) by mouth 2 (two) times daily. 04/25/13   Janeann Forehand, MD  docusate sodium (COLACE) 100 MG capsule Take 100 mg by mouth daily.    Historical Provider, MD  levothyroxine (SYNTHROID, LEVOTHROID) 150 MCG tablet Take 150 mcg by mouth daily.    Historical Provider, MD  Prenatal Vit-Fe Fumarate-FA (PRENATAL MULTIVITAMIN) TABS Take 1 tablet by mouth daily.    Historical Provider, MD   BP 107/70 mmHg  Pulse 74  Temp(Src) 98.2 F (36.8 C) (Oral)  Resp 16  Ht 5\' 4"  (1.626 m)  Wt 140 lb (63.504 kg)  BMI 24.02 kg/m2  SpO2 100%  LMP 01/12/2015 Physical Exam  Constitutional: She is oriented to person, place, and time. She appears well-developed and well-nourished.  Cardiovascular: Normal rate.   Musculoskeletal: She exhibits tenderness.  Swollen red left elbow bursa,  Pain with range of  motion  Neurological: She is alert and oriented to person, place, and time. She has normal reflexes.  Skin: Skin is warm.  Psychiatric: She has a normal mood and affect.  Nursing note and vitals reviewed.   ED Course  Procedures (including critical care time) Labs Review Labs Reviewed - No data to display  Imaging Review No results found.   MDM   1. Bursitis of elbow, left    Ibuprofen Hydrocodone Sling See Dr. Darene Lamer for recheck AVS    Fransico Meadow, PA-C 01/12/15 [phone]

## 2015-01-12 NOTE — ED Notes (Signed)
Patient reports onset of left elbow pain yesterday that progressed to severe pain by nightfall. Pain worse with movement; feels warm to touch; somewhat reddened. Has taken ibuprofen without relief. Denies any injury.

## 2015-01-16 ENCOUNTER — Ambulatory Visit (INDEPENDENT_AMBULATORY_CARE_PROVIDER_SITE_OTHER): Payer: BC Managed Care – PPO

## 2015-01-16 ENCOUNTER — Encounter: Payer: Self-pay | Admitting: Sports Medicine

## 2015-01-16 ENCOUNTER — Ambulatory Visit (INDEPENDENT_AMBULATORY_CARE_PROVIDER_SITE_OTHER): Payer: BC Managed Care – PPO | Admitting: Sports Medicine

## 2015-01-16 VITALS — BP 105/66 | HR 67 | Ht 64.0 in | Wt 142.0 lb

## 2015-01-16 DIAGNOSIS — L03119 Cellulitis of unspecified part of limb: Secondary | ICD-10-CM | POA: Insufficient documentation

## 2015-01-16 DIAGNOSIS — M25422 Effusion, left elbow: Secondary | ICD-10-CM

## 2015-01-16 MED ORDER — FLUCONAZOLE 150 MG PO TABS: 150.0000 mg | ORAL_TABLET | Freq: Once | ORAL | Status: DC

## 2015-01-16 MED ORDER — DOXYCYCLINE HYCLATE 100 MG PO TABS: 100.0000 mg | ORAL_TABLET | Freq: Two times a day (BID) | ORAL | Status: AC

## 2015-01-16 NOTE — Assessment & Plan Note (Signed)
Bedside ultrasound not showing any collections, I did see diffuse edema in the subcutaneous tissue, elbow joint was not effused. X-rays, doxycycline.  Return in one week.

## 2015-01-16 NOTE — Progress Notes (Signed)
   Subjective:    I'm seeing this patient as a consultation for:  Marcene Brawn PA-C  CC: Left elbow swelling  HPI: This is a very pleasant 34 year old female, for the past week she's noted increasing pain and swelling of her left elbow with significant erythema extending midway down the forearm, and several inches proximal to the olecranon. She denies any trauma, no constitutional symptoms, no change in activity. She was seen in urgent care, and referred to me for further evaluation and definitive treatment. Since she was seen in urgent care she has been doing well with occasional anti-inflammatory, symptoms are mildly improving.  Past medical history, Surgical history, Family history not pertinant except as noted below, Social history, Allergies, and medications have been entered into the medical record, reviewed, and no changes needed.   Review of Systems: No headache, visual changes, nausea, vomiting, diarrhea, constipation, dizziness, abdominal pain, skin rash, fevers, chills, night sweats, weight loss, swollen lymph nodes, body aches, joint swelling, muscle aches, chest pain, shortness of breath, mood changes, visual or auditory hallucinations.   Objective:   General: Well Developed, well nourished, and in no acute distress.  Neuro/Psych: Alert and oriented x3, extra-ocular muscles intact, able to move all 4 extremities, sensation grossly intact. Skin: Warm and dry, no rashes noted.  Respiratory: Not using accessory muscles, speaking in full sentences, trachea midline.  Cardiovascular: Pulses palpable, no extremity edema. Abdomen: Does not appear distended. Left Elbow: There is full as an swelling with erythema, mild tenderness, and mild induration over the posterior elbow. There is no ballotability suggesting no olecranon bursitis. Range of motion full pronation, supination, flexion, extension. Strength is full to all of the above directions Stable to varus, valgus stress. Negative  moving valgus stress test. No discrete areas of tenderness to palpation. Ulnar nerve does not sublux. Negative cubital tunnel Tinel's.  Unofficial bedside ultrasound was performed showing edema in the subcutaneous tissues but no collection over the olecranon. Diagnosis is cellulitis.  Impression and Recommendations:   This case required medical decision making of moderate complexity.

## 2015-01-31 ENCOUNTER — Ambulatory Visit: Payer: BC Managed Care – PPO | Admitting: Sports Medicine

## 2015-12-21 LAB — OB RESULTS CONSOLE HIV ANTIBODY (ROUTINE TESTING)
HIV: NONREACTIVE
HIV: NONREACTIVE
HIV: NONREACTIVE

## 2015-12-21 LAB — OB RESULTS CONSOLE GC/CHLAMYDIA
Chlamydia: NEGATIVE
GC PROBE AMP, GENITAL: NEGATIVE

## 2015-12-21 LAB — OB RESULTS CONSOLE RPR: RPR: NONREACTIVE

## 2015-12-31 NOTE — L&D Delivery Note (Signed)
Delivery Note Pt reached complete dilation and pushed about an hour.  After delivery of the head the posterior right arm was in a compound presentation and was delivered followed by the anterior shoulder. At 4:16 PM a healthy female was delivered via Vaginal, Spontaneous Delivery (Presentation: Right Occiput Anterior).  APGAR: 9, 9; weight pending .   Placenta status: Intact, Spontaneous.  Cord: 3 vessels with the following complications: None.   Anesthesia: Epidural  Episiotomy: None Lacerations: 2nd degree Suture Repair: 3.0 vicryl rapide Est. Blood Loss (mL):  220ml  Mom to postpartum.  Baby to Couplet care / Skin to Skin. D/w pt and husband circumcision and they desire to proceed.  Logan Bores 07/02/2016, 4:44 PM

## 2016-05-13 ENCOUNTER — Encounter: Payer: BC Managed Care – PPO | Attending: Obstetrics and Gynecology | Admitting: Skilled Nursing Facility1

## 2016-05-13 VITALS — Ht 64.0 in | Wt 164.0 lb

## 2016-05-13 DIAGNOSIS — Z3A30 30 weeks gestation of pregnancy: Secondary | ICD-10-CM | POA: Insufficient documentation

## 2016-05-13 DIAGNOSIS — O24410 Gestational diabetes mellitus in pregnancy, diet controlled: Secondary | ICD-10-CM | POA: Diagnosis present

## 2016-05-15 ENCOUNTER — Encounter: Payer: Self-pay | Admitting: Skilled Nursing Facility1

## 2016-05-15 NOTE — Progress Notes (Signed)
  Patient was seen on 05/15/2016 for Gestational Diabetes self-management class at the [facility]. The following learning objectives were met by the patient during this course:   States the definition of Gestational Diabetes  States why dietary management is important in controlling blood glucose  Describes the effects each nutrient has on blood glucose levels  Demonstrates ability to create a balanced meal plan  Demonstrates carbohydrate counting   States when to check blood glucose levels  Demonstrates proper blood glucose monitoring techniques  States the effect of stress and exercise on blood glucose levels  States the importance of limiting caffeine and abstaining from alcohol and smoking  Blood glucose monitor given: One Touch Verio Flex Lot # [id number] X Exp: 02/2017 Blood glucose reading: 80  Patient instructed to monitor glucose levels: FBS: 60 - <90 1 hour: <140 2 hour: <120  *Patient received handouts:  Nutrition Diabetes and Pregnancy  Carbohydrate Counting List  Patient will be seen for follow-up as needed.

## 2016-06-18 LAB — OB RESULTS CONSOLE GBS: STREP GROUP B AG: NEGATIVE

## 2016-07-02 ENCOUNTER — Inpatient Hospital Stay (HOSPITAL_COMMUNITY): Payer: BC Managed Care – PPO | Admitting: Anesthesiology

## 2016-07-02 ENCOUNTER — Encounter (HOSPITAL_COMMUNITY): Payer: Self-pay

## 2016-07-02 ENCOUNTER — Inpatient Hospital Stay (HOSPITAL_COMMUNITY)
Admission: AD | Admit: 2016-07-02 | Discharge: 2016-07-04 | DRG: 775 | Disposition: A | Discharge status: Home / Self Care | Payer: BC Managed Care – PPO | Source: Ambulatory Visit | Attending: Obstetrics and Gynecology | Admitting: Obstetrics and Gynecology

## 2016-07-02 DIAGNOSIS — Z810 Family history of intellectual disabilities: Secondary | ICD-10-CM

## 2016-07-02 DIAGNOSIS — Z8249 Family history of ischemic heart disease and other diseases of the circulatory system: Secondary | ICD-10-CM

## 2016-07-02 DIAGNOSIS — O99284 Endocrine, nutritional and metabolic diseases complicating childbirth: Secondary | ICD-10-CM | POA: Diagnosis present

## 2016-07-02 DIAGNOSIS — M419 Scoliosis, unspecified: Secondary | ICD-10-CM | POA: Diagnosis present

## 2016-07-02 DIAGNOSIS — O26643 Intrahepatic cholestasis of pregnancy, third trimester: Secondary | ICD-10-CM | POA: Diagnosis present

## 2016-07-02 DIAGNOSIS — O26613 Liver and biliary tract disorders in pregnancy, third trimester: Secondary | ICD-10-CM

## 2016-07-02 DIAGNOSIS — E039 Hypothyroidism, unspecified: Secondary | ICD-10-CM | POA: Diagnosis present

## 2016-07-02 DIAGNOSIS — K831 Obstruction of bile duct: Secondary | ICD-10-CM | POA: Diagnosis present

## 2016-07-02 DIAGNOSIS — Z85850 Personal history of malignant neoplasm of thyroid: Secondary | ICD-10-CM | POA: Diagnosis not present

## 2016-07-02 DIAGNOSIS — O9962 Diseases of the digestive system complicating childbirth: Secondary | ICD-10-CM | POA: Diagnosis present

## 2016-07-02 DIAGNOSIS — O2662 Liver and biliary tract disorders in childbirth: Principal | ICD-10-CM | POA: Diagnosis present

## 2016-07-02 DIAGNOSIS — O99344 Other mental disorders complicating childbirth: Secondary | ICD-10-CM | POA: Diagnosis present

## 2016-07-02 DIAGNOSIS — F909 Attention-deficit hyperactivity disorder, unspecified type: Secondary | ICD-10-CM | POA: Diagnosis present

## 2016-07-02 DIAGNOSIS — O326XX0 Maternal care for compound presentation, not applicable or unspecified: Secondary | ICD-10-CM | POA: Diagnosis present

## 2016-07-02 DIAGNOSIS — R200 Anesthesia of skin: Secondary | ICD-10-CM | POA: Diagnosis not present

## 2016-07-02 DIAGNOSIS — O24420 Gestational diabetes mellitus in childbirth, diet controlled: Secondary | ICD-10-CM | POA: Diagnosis present

## 2016-07-02 DIAGNOSIS — Z3A37 37 weeks gestation of pregnancy: Secondary | ICD-10-CM | POA: Diagnosis not present

## 2016-07-02 DIAGNOSIS — K219 Gastro-esophageal reflux disease without esophagitis: Secondary | ICD-10-CM | POA: Diagnosis present

## 2016-07-02 LAB — TYPE AND SCREEN
ABO/RH(D): A POS
Antibody Screen: NEGATIVE

## 2016-07-02 LAB — BASIC METABOLIC PANEL
ANION GAP: 8 (ref 5–15)
BUN: 15 mg/dL (ref 6–20)
CHLORIDE: 104 mmol/L (ref 101–111)
CO2: 22 mmol/L (ref 22–32)
Calcium: 8 mg/dL — ABNORMAL LOW (ref 8.9–10.3)
Creatinine, Ser: 0.49 mg/dL (ref 0.44–1.00)
GFR calc Af Amer: >60 mL/min (ref >60)
Glucose, Bld: 92 mg/dL (ref 65–99)
POTASSIUM: 4 mmol/L (ref 3.5–5.1)
SODIUM: 134 mmol/L — AB (ref 135–145)

## 2016-07-02 LAB — CBC
HEMATOCRIT: 38 % (ref 36.0–46.0)
Hemoglobin: 13.2 g/dL (ref 12.0–15.0)
MCH: 31.3 pg (ref 26.0–34.0)
MCHC: 34.7 g/dL (ref 30.0–36.0)
MCV: 90 fL (ref 78.0–100.0)
PLATELETS: 145 10*3/uL — AB (ref 150–400)
RBC: 4.22 MIL/uL (ref 3.87–5.11)
RDW: 13.4 % (ref 11.5–15.5)
WBC: 9.3 10*3/uL (ref 4.0–10.5)

## 2016-07-02 LAB — ABO/RH: ABO/RH(D): A POS

## 2016-07-02 LAB — GLUCOSE, CAPILLARY: Glucose-Capillary: 82 mg/dL (ref 65–99)

## 2016-07-02 MED ORDER — ACETAMINOPHEN 325 MG PO TABS: 650.0000 mg | ORAL_TABLET | ORAL | Status: DC | PRN

## 2016-07-02 MED ORDER — BUTORPHANOL TARTRATE 1 MG/ML IJ SOLN: 1.0000 mg | INTRAMUSCULAR | Status: DC | PRN

## 2016-07-02 MED ORDER — LIDOCAINE HCL (PF) 1 % IJ SOLN
INTRAMUSCULAR | Status: DC | PRN
  Administered 2016-07-02 (×2): 4 mL via EPIDURAL

## 2016-07-02 MED ORDER — OXYTOCIN BOLUS FROM INFUSION
500.0000 mL | INTRAVENOUS | Status: DC
  Administered 2016-07-02: 500 mL via INTRAVENOUS

## 2016-07-02 MED ORDER — EPHEDRINE 5 MG/ML INJ
10.0000 mg | INTRAVENOUS | Status: DC | PRN
  Administered 2016-07-02: 10 mg via INTRAVENOUS
  Filled 2016-07-02: qty 2
  Filled 2016-07-02 (×2): qty 4

## 2016-07-02 MED ORDER — DIPHENHYDRAMINE HCL 50 MG/ML IJ SOLN: 12.5000 mg | INTRAMUSCULAR | Status: DC | PRN

## 2016-07-02 MED ORDER — PHENYLEPHRINE 40 MCG/ML (10ML) SYRINGE FOR IV PUSH (FOR BLOOD PRESSURE SUPPORT)
80.0000 ug | PREFILLED_SYRINGE | INTRAVENOUS | Status: AC | PRN
  Administered 2016-07-02 (×3): 80 ug via INTRAVENOUS

## 2016-07-02 MED ORDER — OXYCODONE-ACETAMINOPHEN 5-325 MG PO TABS
2.0000 | ORAL_TABLET | ORAL | Status: DC | PRN
Start: 2016-07-02 — End: 2016-07-02

## 2016-07-02 MED ORDER — LACTATED RINGERS IV SOLN
500.0000 mL | INTRAVENOUS | Status: DC | PRN
  Administered 2016-07-02 (×4): 500 mL via INTRAVENOUS

## 2016-07-02 MED ORDER — EPHEDRINE 5 MG/ML INJ
10.0000 mg | INTRAVENOUS | Status: DC | PRN
  Administered 2016-07-02: 10 mg via INTRAVENOUS
  Filled 2016-07-02: qty 2

## 2016-07-02 MED ORDER — DIPHENHYDRAMINE HCL 25 MG PO CAPS: 25.0000 mg | ORAL_CAPSULE | Freq: Four times a day (QID) | ORAL | Status: DC | PRN

## 2016-07-02 MED ORDER — PRENATAL MULTIVITAMIN CH
1.0000 | ORAL_TABLET | Freq: Every day | ORAL | Status: DC
  Administered 2016-07-03 – 2016-07-04 (×2): 1 via ORAL
  Filled 2016-07-02 (×2): qty 1

## 2016-07-02 MED ORDER — TETANUS-DIPHTH-ACELL PERTUSSIS 5-2.5-18.5 LF-MCG/0.5 IM SUSP: 0.5000 mL | Freq: Once | INTRAMUSCULAR | Status: DC

## 2016-07-02 MED ORDER — FENTANYL 2.5 MCG/ML BUPIVACAINE 1/10 % EPIDURAL INFUSION (WH - ANES)
14.0000 mL/h | INTRAMUSCULAR | Status: DC | PRN
  Administered 2016-07-02: 14 mL/h via EPIDURAL
  Filled 2016-07-02: qty 125

## 2016-07-02 MED ORDER — OXYTOCIN 40 UNITS IN LACTATED RINGERS INFUSION - SIMPLE MED: 2.5000 [IU]/h | INTRAVENOUS | Status: DC

## 2016-07-02 MED ORDER — LACTATED RINGERS IV SOLN
INTRAVENOUS | Status: DC
  Administered 2016-07-02 (×3): 125 mL/h via INTRAVENOUS

## 2016-07-02 MED ORDER — ONDANSETRON HCL 4 MG PO TABS: 4.0000 mg | ORAL_TABLET | ORAL | Status: DC | PRN

## 2016-07-02 MED ORDER — IBUPROFEN 600 MG PO TABS
600.0000 mg | ORAL_TABLET | Freq: Four times a day (QID) | ORAL | Status: DC
  Administered 2016-07-02 – 2016-07-04 (×8): 600 mg via ORAL
  Filled 2016-07-02 (×8): qty 1

## 2016-07-02 MED ORDER — SIMETHICONE 80 MG PO CHEW
80.0000 mg | CHEWABLE_TABLET | ORAL | Status: DC | PRN
  Filled 2016-07-02: qty 1

## 2016-07-02 MED ORDER — OXYCODONE HCL 5 MG PO TABS: 10.0000 mg | ORAL_TABLET | ORAL | Status: DC | PRN

## 2016-07-02 MED ORDER — LIDOCAINE HCL (PF) 1 % IJ SOLN
30.0000 mL | INTRAMUSCULAR | Status: DC | PRN
  Filled 2016-07-02: qty 30

## 2016-07-02 MED ORDER — ZOLPIDEM TARTRATE 5 MG PO TABS: 5.0000 mg | ORAL_TABLET | Freq: Every evening | ORAL | Status: DC | PRN

## 2016-07-02 MED ORDER — OXYTOCIN 40 UNITS IN LACTATED RINGERS INFUSION - SIMPLE MED
1.0000 m[IU]/min | INTRAVENOUS | Status: DC
  Administered 2016-07-02: 2 m[IU]/min via INTRAVENOUS
  Filled 2016-07-02: qty 1000

## 2016-07-02 MED ORDER — LEVOTHYROXINE SODIUM 137 MCG PO TABS
137.0000 ug | ORAL_TABLET | Freq: Every day | ORAL | Status: DC
  Administered 2016-07-03 – 2016-07-04 (×2): 137 ug via ORAL
  Filled 2016-07-02 (×2): qty 1

## 2016-07-02 MED ORDER — COCONUT OIL OIL: 1.0000 "application " | TOPICAL_OIL | Status: DC | PRN

## 2016-07-02 MED ORDER — LACTATED RINGERS IV SOLN: 500.0000 mL | Freq: Once | INTRAVENOUS | Status: DC

## 2016-07-02 MED ORDER — DIBUCAINE 1 % RE OINT: 1.0000 "application " | TOPICAL_OINTMENT | RECTAL | Status: DC | PRN

## 2016-07-02 MED ORDER — SENNOSIDES-DOCUSATE SODIUM 8.6-50 MG PO TABS
2.0000 | ORAL_TABLET | ORAL | Status: DC
  Administered 2016-07-03 – 2016-07-04 (×2): 2 via ORAL
  Filled 2016-07-02 (×2): qty 2

## 2016-07-02 MED ORDER — TERBUTALINE SULFATE 1 MG/ML IJ SOLN
0.2500 mg | Freq: Once | INTRAMUSCULAR | Status: DC | PRN
  Filled 2016-07-02: qty 1

## 2016-07-02 MED ORDER — OXYCODONE-ACETAMINOPHEN 5-325 MG PO TABS
1.0000 | ORAL_TABLET | ORAL | Status: DC | PRN
Start: 2016-07-02 — End: 2016-07-02

## 2016-07-02 MED ORDER — ONDANSETRON HCL 4 MG/2ML IJ SOLN: 4.0000 mg | Freq: Four times a day (QID) | INTRAMUSCULAR | Status: DC | PRN

## 2016-07-02 MED ORDER — PHENYLEPHRINE 40 MCG/ML (10ML) SYRINGE FOR IV PUSH (FOR BLOOD PRESSURE SUPPORT)
80.0000 ug | PREFILLED_SYRINGE | INTRAVENOUS | Status: DC | PRN
  Administered 2016-07-02: 80 ug via INTRAVENOUS
  Filled 2016-07-02: qty 10
  Filled 2016-07-02: qty 5

## 2016-07-02 MED ORDER — OXYCODONE HCL 5 MG PO TABS
5.0000 mg | ORAL_TABLET | ORAL | Status: DC | PRN
  Administered 2016-07-02 – 2016-07-04 (×6): 5 mg via ORAL
  Filled 2016-07-02 (×6): qty 1

## 2016-07-02 MED ORDER — WITCH HAZEL-GLYCERIN EX PADS: 1.0000 "application " | MEDICATED_PAD | CUTANEOUS | Status: DC | PRN

## 2016-07-02 MED ORDER — SOD CITRATE-CITRIC ACID 500-334 MG/5ML PO SOLN: 30.0000 mL | ORAL | Status: DC | PRN

## 2016-07-02 MED ORDER — ONDANSETRON HCL 4 MG/2ML IJ SOLN: 4.0000 mg | INTRAMUSCULAR | Status: DC | PRN

## 2016-07-02 MED ORDER — BENZOCAINE-MENTHOL 20-0.5 % EX AERO
1.0000 "application " | INHALATION_SPRAY | CUTANEOUS | Status: DC | PRN
  Administered 2016-07-02: 1 via TOPICAL
  Filled 2016-07-02: qty 56

## 2016-07-02 NOTE — Anesthesia Preprocedure Evaluation (Addendum)
Anesthesia Evaluation  Patient identified by MRN, date of birth, ID band Patient awake    Reviewed: Allergy & Precautions, NPO status , Patient's Chart, lab work & pertinent test results  History of Anesthesia Complications (+) history of anesthetic complications  Airway Mallampati: II  TM Distance: >3 FB Neck ROM: Full    Dental no notable dental hx. (+) Teeth Intact   Pulmonary neg pulmonary ROS,    Pulmonary exam normal breath sounds clear to auscultation       Cardiovascular Normal cardiovascular exam Rhythm:Regular Rate:Normal  Hx/o ASD as a child closed spontaneously   Neuro/Psych PSYCHIATRIC DISORDERS ADHDnegative neurological ROS     GI/Hepatic GERD  Medicated and Controlled,Cholestasis of pregnancy   Endo/Other  diabetes, Well ControlledHypothyroidism Hx/o Thyroid Ca- S/P Thyroidectomy Hypothyroidism  Renal/GU   negative genitourinary   Musculoskeletal Scoliosis   Abdominal   Peds  Hematology Thrombocytopenia-mild   Anesthesia Other Findings   Reproductive/Obstetrics (+) Pregnancy 37 weeks                            Anesthesia Physical Anesthesia Plan  ASA: II  Anesthesia Plan: Epidural   Post-op Pain Management:    Induction:   Airway Management Planned: Natural Airway  Additional Equipment:   Intra-op Plan:   Post-operative Plan:   Informed Consent: I have reviewed the patients History and Physical, chart, labs and discussed the procedure including the risks, benefits and alternatives for the proposed anesthesia with the patient or authorized representative who has indicated his/her understanding and acceptance.   Dental advisory given  Plan Discussed with: Anesthesiologist  Anesthesia Plan Comments:         Anesthesia Quick Evaluation

## 2016-07-02 NOTE — Anesthesia Pain Management Evaluation Note (Signed)
  CRNA Pain Management Visit Note  Patient: Kathryn Dunn, 35 y.o., female  "Hello I am a member of the anesthesia team at [facility]. We have an anesthesia team available at all times to provide care throughout the hospital, including epidural management and anesthesia for C-section. I don't know your plan for the delivery whether it a natural birth, water birth, IV sedation, nitrous supplementation, doula or epidural, but we want to meet your pain goals."   1.Was your pain managed to your expectations on prior hospitalizations?   Yes   2.What is your expectation for pain management during this hospitalization?     Epidural  3.How can we help you reach that goal? Epidural  Record the patient's initial score and the patient's pain goal.   Pain: 0  Pain Goal: 5 [facility] wants you to be able to say your pain was always managed very well.  Casimer Lanius 07/02/2016

## 2016-07-02 NOTE — Progress Notes (Signed)
Patient ID: Kathryn Dunn, female   DOB: 07/10/1981, 35 y.o.   MRN: [id number] Pt comfortable with epidural Had low BP immediately followiing but responded to fluids and meds  FHR category 1 Cervix 4-5/80/-1  IUPC placed to adjust pitocin Will follow progress.

## 2016-07-02 NOTE — Progress Notes (Signed)
Patient ID: Kathryn Dunn, female   DOB: 1981-05-26, 35 y.o.   MRN: [id number] Pt feeling mild contractions afeb vss BS 95 this AM FHR Category 1 Cervix 2-3/30/-2 AROM clear  Pitocin started Plans epidural

## 2016-07-02 NOTE — Progress Notes (Signed)
Patient ID: Kathryn Dunn, female   DOB: 1981/01/21, 35 y.o.   MRN: [id number] Pt doing well, comfortable afeb vss FHR category 1 Cervix 9+/c/0  Preparing for delivery.

## 2016-07-02 NOTE — Anesthesia Procedure Notes (Signed)
Epidural Patient location during procedure: OB Start time: 07/02/2016 12:20 PM  Staffing Anesthesiologist: Josephine Igo Performed by: anesthesiologist   Preanesthetic Checklist Completed: patient identified, site marked, surgical consent, pre-op evaluation, timeout performed, IV checked, risks and benefits discussed and monitors and equipment checked  Epidural Patient position: sitting Prep: site prepped and draped and DuraPrep Patient monitoring: continuous pulse ox and blood pressure Approach: midline Location: L3-L4 Injection technique: LOR air  Needle:  Needle type: Tuohy  Needle gauge: 17 G Needle length: 9 cm and 9 Needle insertion depth: 4 cm Catheter type: closed end flexible Catheter size: 19 Gauge Catheter at skin depth: 9 cm Test dose: negative and Other  Assessment Events: blood not aspirated, injection not painful, no injection resistance, negative IV test and no paresthesia  Additional Notes Patient identified. Risks and benefits discussed including failed block, incomplete  Pain control, post dural puncture headache, nerve damage, paralysis, blood pressure Changes, nausea, vomiting, reactions to medications-both toxic and allergic and post Partum back pain. All questions were answered. Patient expressed understanding and wished to proceed. Sterile technique was used throughout procedure. Epidural site was Dressed with sterile barrier dressing. No paresthesias, signs of intravascular injection Or signs of intrathecal spread were encountered.  Patient was more comfortable after the epidural was dosed. Please see RN's note for documentation of vital signs and FHR which are stable.

## 2016-07-02 NOTE — H&P (Signed)
Kathryn Dunn is a 35 y.o. female G3P2002 at 21 3/7 weeks (EDD 07/19/16 by LMP c/w 10 week [place]) presenting for induction of labor for cholestasis of pregnancy diagnosed by elevated bile acids of 12.8 at [redacted] weeks gestation when the patient began to have severe itching.  She has been on actigall BID and had close surveillance with NST's 2x weekly.  Prenatal care further complicated by GDM which has been very well controlled on diet.  She has a personal h/o thyroid cancer with partial thyroidectomy and is on synthroid replacement and followed by endocrinology with levels adjusted as need this pregnancy.   Maternal Medical History:  Contractions: Frequency: irregular.   Perceived severity is mild.    Fetal activity: Perceived fetal activity is normal.    Prenatal Complications - Diabetes: gestational. Diabetes is managed by diet.      OB History    Gravida Para Term Preterm AB TAB SAB Ectopic Multiple Living   2 2 2  0 0 0 0 0 0 2    2010 vacuum 9#9oz 2013 NSVD 7#70z  Past Medical History  Diagnosis Date  . ADHD (attention deficit hyperactivity disorder)   . Gestational diabetes     diet controlled  . 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 Thyroid cancer ([place])     thyroid removed  . Hypothyroidism   . Scoliosis   . Complication of anesthesia     vomitting during surgery  . Patient is a currently breast-feeding mother    Past Surgical History  Procedure Laterality Date  . Thyroidectomy    . Wisdom tooth extraction    . Thyroidectomy     Family History: family history includes ADD / ADHD in her brother and father; Cancer in her cousin and maternal grandmother; Hypertension in her father and paternal grandfather; Kidney disease in her paternal grandmother; Mental retardation in her cousin; Thyroid cancer in her maternal uncle. Social History:  reports that she has never smoked. She has never used smokeless tobacco. She reports that she does not drink alcohol or  use illicit drugs.   Prenatal Transfer Tool  Maternal Diabetes: Yes:  Diabetes Type:  Diet controlled Genetic Screening: Normal Maternal Ultrasounds/Referrals: Normal Fetal Ultrasounds or other Referrals:  None Maternal Substance Abuse:  No Significant Maternal Medications:  Meds include: Syntroid, actigall Significant Maternal Lab Results:  Lab values include: Other: elevated bile acids Other Comments:  None  Review of Systems  Skin: Positive for itching.      There were no vitals taken for this visit. Maternal Exam:  Uterine Assessment: Contraction strength is mild.  Contraction frequency is irregular.   Abdomen: Patient reports no abdominal tenderness. Fetal presentation: vertex  Introitus: Normal vulva. Normal vagina.  Pelvis: adequate for delivery.      Physical Exam  Constitutional: She appears well-developed.  Cardiovascular: Normal rate and regular rhythm.   Respiratory: Effort normal.  GI: Soft.  Genitourinary: Vagina normal.  Neurological: She is alert.  Psychiatric: She has a normal mood and affect.    Prenatal labs: ABO, Rh:  A positive Antibody:  negative Rubella:  Immune RPR:   NR HBsAg:   Neg HIV:   NR GBS:   Neg First trimester screen and AFP negative Early glucola 113 One hour GCT at 28 weeks 168 Three hour GTT 82/181/156/112  Assessment/Plan: Pt admitted for IOL for cholestasis of pregnancy for fetal benefit.  Plan pitocin and AROM.  GDM has been well-controlled on diet.  Will follow CBG as needed in labor. Plan AROM, pitocin, epidural.  Kathryn Dunn W 07/02/2016, 8:39 AM

## 2016-07-03 LAB — CBC
HEMATOCRIT: 34 % — AB (ref 36.0–46.0)
HEMOGLOBIN: 11.6 g/dL — AB (ref 12.0–15.0)
MCH: 31.4 pg (ref 26.0–34.0)
MCHC: 34.1 g/dL (ref 30.0–36.0)
MCV: 92.1 fL (ref 78.0–100.0)
Platelets: 121 10*3/uL — ABNORMAL LOW (ref 150–400)
RBC: 3.69 MIL/uL — AB (ref 3.87–5.11)
RDW: 13.6 % (ref 11.5–15.5)
WBC: 11.6 10*3/uL — ABNORMAL HIGH (ref 4.0–10.5)

## 2016-07-03 NOTE — Lactation Note (Signed)
This note was copied from a baby's chart. Lactation Consultation Note: Mother was given Lactation Brochure. She is an experienced mother with breastfeeding 2 other children for 1 year each. Mother was informed of available [place] services , BFSG'S and community support.   Patient Name: Kathryn Dunn [id number] Date: 07/03/2016 Reason for consult: Initial assessment   Maternal Data Has patient been taught Hand Expression?: Yes Does the patient have breastfeeding experience prior to this delivery?: Yes  Feeding    LATCH Score/Interventions                      Lactation Tools Discussed/Used     Consult Status Consult Status: PRN    Darla Lesches 07/03/2016, 12:00 PM

## 2016-07-03 NOTE — Lactation Note (Signed)
This note was copied from a baby's chart. Lactation Consultation Note  Patient Name: Kathryn Dunn [id number] Date: 07/03/2016 Reason for consult: Follow-up assessment  Mom had called [place] office earlier this afternoon. She was interested in having an IBCLC observe her latch. Her infant is currently sleeping (s/p circ), but Mom has my # to call to assess at next feeding.   Matthias Hughs [facility] 07/03/2016, 8:32 PM

## 2016-07-03 NOTE — Progress Notes (Signed)
Pt says left thigh feels like it has reduced circulation, cannot qualify it any other way.  No significant swelling.  Had some pain and numbness lateral left thigh the week before delivery.  Left thigh even felt different when numb from epidural.   LE-no significant edema Issue is probably nerve related, no evidence of DVT.  Reassured that this will most likely get better soon, encouraged to call if symptoms get worse or if no improvement.  She wants to stay in the hospital today, go home tomorrow.

## 2016-07-03 NOTE — Progress Notes (Signed)
PPD #1 No problems Afeb, VSS Fundus firm, NT at U-1 Continue routine postpartum care 

## 2016-07-03 NOTE — Anesthesia Postprocedure Evaluation (Signed)
Anesthesia Post Note  Patient: Kathryn Dunn  Procedure(s) Performed: * No procedures listed *  Patient location during evaluation: Mother Baby Anesthesia Type: Epidural Level of consciousness: awake and alert Pain management: pain level controlled Vital Signs Assessment: post-procedure vital signs reviewed and stable Respiratory status: spontaneous breathing, nonlabored ventilation and respiratory function stable Cardiovascular status: stable Postop Assessment: no headache, no backache and epidural receding Anesthetic complications: no     Last Vitals:  Filed Vitals:   07/03/16 0005 07/03/16 0557  BP: 105/66 93/60  Pulse: 78 79  Temp: 36.8 C 36.8 C  Resp: 20 18    Last Pain:  Filed Vitals:   07/03/16 0559  PainSc: 4    Pain Goal: Patients Stated Pain Goal: 2 (07/03/16 0553)               Riki Sheer

## 2016-07-04 LAB — RPR: RPR: NONREACTIVE

## 2016-07-04 MED ORDER — OXYCODONE HCL 5 MG PO TABS: 5.0000 mg | ORAL_TABLET | ORAL | Status: AC | PRN

## 2016-07-04 MED ORDER — IBUPROFEN 600 MG PO TABS: 600.0000 mg | ORAL_TABLET | Freq: Four times a day (QID) | ORAL | Status: AC | PRN

## 2016-07-04 NOTE — Discharge Instructions (Signed)
Nothing in vagina for 6 weeks.  No sex, tampons, and douching.  Other instructions as in [facility] Discharge Booklet. °

## 2016-07-04 NOTE — Lactation Note (Signed)
This note was copied from a baby's chart. Lactation Consultation Note  Patient Name: Kathryn Dunn [id number] Date: 07/04/2016 Reason for consult: Follow-up assessment;Other (Comment) (early term) Mom reports breast are filling, had oversupply with BF older children. Mom asked for assist with positioning baby in cross cradle/football hold. Baby latches well, demonstrates good suckling bursts with swallows noted. Encouraged to BF with feeding ques, at least 8-12 times or more in 24 hours. Keep baby nursing for 15-20 minutes, both breasts some feedings. Engorgement care reviewed if needed, advised of OP services and support group. Mom to call for questions/concerns.   Maternal Data    Feeding Feeding Type: Breast Fed Length of feed: 9 min  LATCH Score/Interventions Latch: Grasps breast easily, tongue down, lips flanged, rhythmical sucking.  Audible Swallowing: Spontaneous and intermittent  Type of Nipple: Everted at rest and after stimulation  Comfort (Breast/Nipple): Filling, red/small blisters or bruises, mild/mod discomfort  Problem noted: Filling Interventions (Filling): Frequent nursing  Hold (Positioning): Assistance needed to correctly position infant at breast and maintain latch. Intervention(s): Breastfeeding basics reviewed;Support Pillows;Position options;Skin to skin  LATCH Score: 8  Lactation Tools Discussed/Used     Consult Status Consult Status: Complete Date: 07/04/16 Follow-up type: In-patient    Katrine Coho 07/04/2016, 9:44 AM

## 2016-07-04 NOTE — Discharge Summary (Signed)
OB Discharge Summary     Patient Name: Kathryn Dunn DOB: [birth year] MRN: [id number]  Date of admission: 07/02/2016 Delivering MD: Paula Compton   Date of discharge: 07/04/2016  Admitting diagnosis: INDUCTION Intrauterine pregnancy: [redacted]w[redacted]d     Secondary diagnosis:  Active Problems:   Cholestasis of pregnancy in third trimester   NSVD (normal spontaneous vaginal delivery)  Additional problems: none     Discharge diagnosis: Term Pregnancy Delivered                                                                                                Post partum procedures:none  Augmentation: AROM and Pitocin  Complications: None  Hospital course:  Induction of Labor With Vaginal Delivery   35 y.o. yo G3P3003 at [redacted]w[redacted]d was admitted to the hospital 07/02/2016 for induction of labor.  Indication for induction: Cholestasis of pregnancy.  Patient had an uncomplicated labor course as follows: Membrane Rupture Time/Date: 10:24 AM ,07/02/2016   Intrapartum Procedures: Episiotomy: None [1]                                         Lacerations:  2nd degree [3]  Patient had delivery of a Viable infant.  Information for the patient's newborn:  Kathryn, Dunn [id number]  Delivery Method: Vaginal, Spontaneous Delivery (Filed from Delivery Summary)   07/02/2016  Details of delivery can be found in separate delivery note.  Patient had a routine postpartum course. Patient is discharged home 07/04/2016.   Physical exam  Filed Vitals:   07/03/16 0005 07/03/16 0557 07/03/16 1741 07/04/16 0630  BP: 105/66 93/60 94/57  90/52  Pulse: 78 79 61 58  Temp: 98.3 F (36.8 C) 98.2 F (36.8 C) 98.1 F (36.7 C) 97.9 F (36.6 C)  TempSrc: Oral Oral Oral Oral  Resp: 20 18 16 18   Height:      Weight:      SpO2: 100% 99% 97% 97%   General: alert, cooperative and no distress Lochia: appropriate Uterine Fundus: firm Incision: N/A DVT Evaluation: No evidence of DVT seen on physical exam. Labs: Lab  Results  Component Value Date   WBC 11.6* 07/03/2016   HGB 11.6* 07/03/2016   HCT 34.0* 07/03/2016   MCV 92.1 07/03/2016   PLT 121* 07/03/2016   CMP Latest Ref Rng 07/02/2016  Glucose 65 - 99 mg/dL 92  BUN 6 - 20 mg/dL 15  Creatinine 0.44 - 1.00 mg/dL 0.49  Sodium 135 - 145 mmol/L 134(L)  Potassium 3.5 - 5.1 mmol/L 4.0  Chloride 101 - 111 mmol/L 104  CO2 22 - 32 mmol/L 22  Calcium 8.9 - 10.3 mg/dL 8.0(L)    Discharge instruction: per After Visit Summary and "Baby and Me Booklet".  After visit meds:    Medication List    TAKE these medications        docusate sodium 100 MG capsule  Commonly known as:  COLACE  Take 200 mg by mouth at bedtime.     ibuprofen 600 MG  tablet  Commonly known as:  ADVIL,MOTRIN  Take 1 tablet (600 mg total) by mouth every 6 (six) hours as needed.     levothyroxine 137 MCG tablet  Commonly known as:  SYNTHROID, LEVOTHROID  Take 137 mcg by mouth daily before breakfast. Patient takes 1.5 tablets on Saturday only.     oxyCODONE 5 MG immediate release tablet  Commonly known as:  Oxy IR/ROXICODONE  Take 1 tablet (5 mg total) by mouth every 4 (four) hours as needed for severe pain (pain scale 4-7).     prenatal multivitamin Tabs tablet  Take 1 tablet by mouth at bedtime.     ranitidine 150 MG tablet  Commonly known as:  ZANTAC  Take 150 mg by mouth 2 (two) times daily as needed for heartburn.     triamcinolone 0.025 % cream  Commonly known as:  KENALOG  Apply 1 application topically 3 (three) times daily as needed (For itching.).     ursodiol 300 MG capsule  Commonly known as:  ACTIGALL  Take 300 mg by mouth 3 (three) times daily.        Diet: routine diet  Activity: Advance as tolerated. Pelvic rest for 6 weeks.   Outpatient follow up:6 weeks Follow up Appt:No future appointments. Follow up Visit:No Follow-up on file.  Postpartum contraception: Progesterone only pills  Newborn Data: Live born female  Birth Weight: 8 lb 11.2 oz  (3945 g) APGAR: 9, 9  Baby Feeding: Breast Disposition:home with mother   07/04/2016 [name], DO

## 2016-07-04 NOTE — Progress Notes (Signed)
Post Partum Day 2 Subjective: no complaints, up ad lib, voiding, tolerating PO, + flatus and bonding well with baby. Breastfeeding. Ready for discharge to home today  Objective: Blood pressure 90/52, pulse 58, temperature 97.9 F (36.6 C), temperature source Oral, resp. rate 18, height 5\' 5"  (1.651 m), weight 174 lb (78.926 kg), SpO2 97 %, unknown if currently breastfeeding.  Physical Exam:  General: alert, cooperative and no distress Lochia: appropriate Uterine Fundus: firm Incision: n/a DVT Evaluation: No evidence of DVT seen on physical exam. No significant calf/ankle edema.   Recent Labs  07/02/16 0900 07/03/16 0533  HGB 13.2 11.6*  HCT 38.0 34.0*    Assessment/Plan: Discharge home, Breastfeeding and Contraception minipill   LOS: 2 days   [place] 07/04/2016, 9:21 AM

## 2016-07-05 ENCOUNTER — Telehealth (HOSPITAL_COMMUNITY): Payer: Self-pay | Admitting: Lactation Services

## 2016-07-05 NOTE — Telephone Encounter (Signed)
"  Baby is drowning" Her breast are so full that she is having trouble walking.  BF on demand 8+/24hr and pump for comfort. She will freeze the milk for return to work.  Reviewed breast care with heat and ice. She is aware of engorgement, plugged duct, and mastitis signs/symptoms. She will f/u with lactation or her PCP if she has any trouble.

## 2016-07-08 ENCOUNTER — Telehealth (HOSPITAL_COMMUNITY): Payer: Self-pay

## 2016-07-08 ENCOUNTER — Encounter (HOSPITAL_COMMUNITY): Payer: Self-pay | Admitting: *Deleted

## 2016-07-08 ENCOUNTER — Inpatient Hospital Stay (HOSPITAL_COMMUNITY): Payer: BC Managed Care – PPO

## 2016-07-08 ENCOUNTER — Inpatient Hospital Stay (HOSPITAL_COMMUNITY)
Admission: AD | Admit: 2016-07-08 | Discharge: 2016-07-08 | Disposition: A | Discharge status: Home / Self Care | Payer: BC Managed Care – PPO | Source: Ambulatory Visit | Attending: Obstetrics and Gynecology | Admitting: Obstetrics and Gynecology

## 2016-07-08 DIAGNOSIS — O9089 Other complications of the puerperium, not elsewhere classified: Secondary | ICD-10-CM | POA: Insufficient documentation

## 2016-07-08 DIAGNOSIS — E039 Hypothyroidism, unspecified: Secondary | ICD-10-CM | POA: Diagnosis not present

## 2016-07-08 DIAGNOSIS — R109 Unspecified abdominal pain: Secondary | ICD-10-CM | POA: Insufficient documentation

## 2016-07-08 DIAGNOSIS — Z79899 Other long term (current) drug therapy: Secondary | ICD-10-CM | POA: Diagnosis not present

## 2016-07-08 DIAGNOSIS — Z85850 Personal history of malignant neoplasm of thyroid: Secondary | ICD-10-CM | POA: Diagnosis not present

## 2016-07-08 DIAGNOSIS — R10819 Abdominal tenderness, unspecified site: Secondary | ICD-10-CM | POA: Diagnosis present

## 2016-07-08 DIAGNOSIS — F909 Attention-deficit hyperactivity disorder, unspecified type: Secondary | ICD-10-CM | POA: Diagnosis not present

## 2016-07-08 DIAGNOSIS — R5383 Other fatigue: Secondary | ICD-10-CM | POA: Diagnosis not present

## 2016-07-08 DIAGNOSIS — M419 Scoliosis, unspecified: Secondary | ICD-10-CM | POA: Diagnosis not present

## 2016-07-08 DIAGNOSIS — R52 Pain, unspecified: Secondary | ICD-10-CM

## 2016-07-08 LAB — CBC WITH DIFFERENTIAL/PLATELET
BASOS ABS: 0 10*3/uL (ref 0.0–0.1)
Basophils Relative: 0 %
EOS PCT: 2 %
Eosinophils Absolute: 0.1 10*3/uL (ref 0.0–0.7)
HCT: 32 % — ABNORMAL LOW (ref 36.0–46.0)
Hemoglobin: 11 g/dL — ABNORMAL LOW (ref 12.0–15.0)
LYMPHS PCT: 23 %
Lymphs Abs: 1.7 10*3/uL (ref 0.7–4.0)
MCH: 31.5 pg (ref 26.0–34.0)
MCHC: 34.4 g/dL (ref 30.0–36.0)
MCV: 91.7 fL (ref 78.0–100.0)
MONO ABS: 0.5 10*3/uL (ref 0.1–1.0)
Monocytes Relative: 6 %
Neutro Abs: 5 10*3/uL (ref 1.7–7.7)
Neutrophils Relative %: 69 %
PLATELETS: 160 10*3/uL (ref 150–400)
RBC: 3.49 MIL/uL — ABNORMAL LOW (ref 3.87–5.11)
RDW: 13.2 % (ref 11.5–15.5)
WBC: 7.3 10*3/uL (ref 4.0–10.5)

## 2016-07-08 NOTE — MAU Provider Note (Signed)
History     CSN: [id number]  Arrival date and time: 07/08/16 [id number]   First Provider Initiated Contact with Patient 07/08/16 2043      Chief Complaint  Patient presents with  . Fatigue   HPI Comments: Kathryn Dunn is a 35 y.o. G3P3003 who is S/P vaginal delivery on 07/02/16. She reports that since 7/7 when her milk started coming in she started to feel unwell. She reports that by 07/06/16 she was feeling worse. She reports feeling fatigue, body aches and some abdominal tenderness. She reports that she has adequate milk production. She states that her bleeding has been "normal". She states that she passed a clot this morning. She reports it was about the size of a ping pong ball and smelled "rancid". She denies any breast symptoms other than the initial engorgement. She denies any red painful areas to the breasts.    Past Medical History  Diagnosis Date  . ADHD (attention deficit hyperactivity disorder)   . Gestational diabetes     diet controlled  . 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 Thyroid cancer ([place])     thyroid removed  . Hypothyroidism   . Scoliosis   . Complication of anesthesia     vomitting during surgery  . Patient is a currently breast-feeding mother     Past Surgical History  Procedure Laterality Date  . Thyroidectomy    . Wisdom tooth extraction    . Thyroidectomy      Family History  Problem Relation Age of Onset  . ADD / ADHD Father   . Hypertension Father   . ADD / ADHD Brother   . Thyroid cancer Maternal Uncle   . Cancer Maternal Grandmother     breast  . Kidney disease Paternal Grandmother   . Hypertension Paternal Grandfather   . Cancer Cousin     colon  . Mental retardation Cousin     Social History  Substance Use Topics  . Smoking status: Never Smoker   . Smokeless tobacco: Never Used  . Alcohol Use: No    Allergies: No Known Allergies  Prescriptions prior to admission  Medication Sig Dispense Refill Last Dose   . docusate sodium (COLACE) 100 MG capsule Take 200 mg by mouth at bedtime.    07/07/2016 at Unknown time  . levothyroxine (SYNTHROID, LEVOTHROID) 137 MCG tablet Take 137 mcg by mouth daily before breakfast. Patient takes 1.5 tablets on Saturday only.   07/08/2016 at Unknown time  . oxyCODONE (OXY IR/ROXICODONE) 5 MG immediate release tablet Take 1 tablet (5 mg total) by mouth every 4 (four) hours as needed for severe pain (pain scale 4-7). 15 tablet 0 07/06/2016 at Unknown time  . Prenatal Vit-Fe Fumarate-FA (PRENATAL MULTIVITAMIN) TABS Take 1 tablet by mouth at bedtime.    07/07/2016 at Unknown time  . ibuprofen (ADVIL,MOTRIN) 600 MG tablet Take 1 tablet (600 mg total) by mouth every 6 (six) hours as needed. 40 tablet 1 07/08/2016  . ranitidine (ZANTAC) 150 MG tablet Take 150 mg by mouth 2 (two) times daily as needed for heartburn.    Past Week at Unknown time  . triamcinolone (KENALOG) 0.025 % cream Apply 1 application topically 3 (three) times daily as needed (For itching.).   0 More than a month at Unknown time  . ursodiol (ACTIGALL) 300 MG capsule Take 300 mg by mouth 3 (three) times daily.  2 More than a month at Unknown time  Review of Systems  Constitutional: Positive for malaise/fatigue. Negative for fever and chills.  Gastrointestinal: Positive for abdominal pain. Negative for nausea, vomiting, diarrhea and constipation.  Genitourinary: Negative for dysuria, urgency and frequency.  Musculoskeletal: Positive for myalgias.  Neurological: Positive for weakness.   Physical Exam   Blood pressure 108/73, pulse 62, temperature 98.4 F (36.9 C), temperature source Oral, resp. rate 16, SpO2 97 %, unknown if currently breastfeeding.  Physical Exam  Nursing note and vitals reviewed. Constitutional: She is oriented to person, place, and time. She appears well-developed and well-nourished. No distress.  HENT:  Head: Normocephalic.  Cardiovascular: Normal rate.   Respiratory: Effort normal. Right  breast exhibits no inverted nipple, no mass, no skin change and no tenderness. Left breast exhibits no inverted nipple, no mass, no skin change and no tenderness.  GI: Soft. There is tenderness (tender throughout. No focal tenderness ). There is no rebound and no guarding.  Neurological: She is alert and oriented to person, place, and time.  Skin: Skin is warm and dry.  Psychiatric: She has a normal mood and affect.    Results for orders placed or performed during the hospital encounter of 07/08/16 (from the past 24 hour(s))  CBC with Differential     Status: Abnormal   Collection Time: 07/08/16  9:07 PM  Result Value Ref Range   WBC 7.3 4.0 - 10.5 K/uL   RBC 3.49 (L) 3.87 - 5.11 MIL/uL   Hemoglobin 11.0 (L) 12.0 - 15.0 g/dL   HCT 32.0 (L) 36.0 - 46.0 %   MCV 91.7 78.0 - 100.0 fL   MCH 31.5 26.0 - 34.0 pg   MCHC 34.4 30.0 - 36.0 g/dL   RDW 13.2 11.5 - 15.5 %   Platelets 160 150 - 400 K/uL   Neutrophils Relative % 69 %   Neutro Abs 5.0 1.7 - 7.7 K/uL   Lymphocytes Relative 23 %   Lymphs Abs 1.7 0.7 - 4.0 K/uL   Monocytes Relative 6 %   Monocytes Absolute 0.5 0.1 - 1.0 K/uL   Eosinophils Relative 2 %   Eosinophils Absolute 0.1 0.0 - 0.7 K/uL   Basophils Relative 0 %   Basophils Absolute 0.0 0.0 - 0.1 K/uL   US Pelvis Complete  07/08/2016  CLINICAL DATA:  Low abdominal pain for 2 days. Vaginal delivery on 07/02/2016. Normal white cell count. EXAM: TRANSABDOMINAL ULTRASOUND OF PELVIS TECHNIQUE: Transabdominal ultrasound examination of the pelvis was performed including evaluation of the uterus, ovaries, adnexal regions, and pelvic cul-de-sac. COMPARISON:  None. FINDINGS: Uterus Measurements: 13.5 x 6.9 x 9.5 cm. Uterus is anteverted and enlarged consistent with postpartum state. Multiple venous lakes are demonstrated. No fibroids or other mass visualized. Endometrium Thickness: 9 mm. Endometrium is not well defined. No definite endometrial thickening or fluid. No flow demonstrated within  the endometrium on color flow Doppler imaging. No definitive evidence of retained products of conception. Right ovary Measurements: 3.2 x 1.4 x 2 cm. Normal appearance/no adnexal mass. Left ovary Measurements: 2.6 x 1.2 x 1.3 cm. Normal appearance/no adnexal mass. Other findings:  No abnormal free fluid. IMPRESSION: Enlarged uterus consistent with postpartum state. Endometrial stripe thickness is normal. No focal endometrial abnormalities are demonstrated. Electronically Signed   By: Lucienne Capers M.D.   On: 07/08/2016 22:05    MAU Course  Procedures  MDM 2221: D/W Dr. Melba Coon. [place] for DC home.  Assessment and Plan   1. Postpartum pain    DC home Comfort measures reviewed  Return  precautions reviewed  RX: none  Return to MAU as needed FU with OB as planned  Follow-up Information    Follow up with Janyth Contes, MD.   Specialty:  Obstetrics and Gynecology   Why:  As scheduled   Contact information:   [address] [place] [address] [phone]         Mathis Bud 07/08/2016, 8:49 PM

## 2016-07-08 NOTE — Telephone Encounter (Signed)
Pt has an abundance of milk and is engorged. Baby is eating well.  Explained therapeutic breast massage to mother and encouraged it. Also recommended that she drain her breasts well 4 x in 24 hours to help regulate supply and discourage foremilk hind milk imbalance.

## 2016-07-08 NOTE — MAU Note (Signed)
Pt reports she has been achy since Saturday and is having lower abd pain.. S/P vaginal delivery on 07/04

## 2016-07-08 NOTE — Discharge Instructions (Signed)

## 2016-07-08 NOTE — MAU Note (Signed)
PT  SAYS SHE   DEL  VAG  ON 07-02-2016-    D/C  HOME  ON THURS.-  ALL  OK   BREAST FEEDING/  MIL  CAME IN ON  Friday  AM-   ENGORGED.     SAYS ABD FEELS TENDER- FRI-  BUT  SUN  AND  TODAY.   HURTS   TO  WALK .    SAYS BECAME PALE  ON  SAT,  FEELS  TIRED .        THIS AM-  PASSED  SMALL CLOT-  THAT   HAD  ODOR  .

## 2016-07-12 ENCOUNTER — Telehealth (HOSPITAL_COMMUNITY): Payer: Self-pay | Admitting: Lactation Services

## 2016-07-12 NOTE — Telephone Encounter (Signed)
Baby now 42 days old and Mom has over supply of breast milk on left breast resulting in continuous engorgement. Mom has stopped BF on the left breast due to discomfort. She is pumping but breasts will fill back up very quickly. Mom reports she has 35 bags of breast milk in her freezer. Mom denies S/S of mastitis. LC advised Mom to do some block feeding on left breast. This means pre-pump to help with latch, nurse baby on Left breast for 2-3 feedings in a row then begin alternating with the right breast. After each feeding post pump just to comfort, apply ice packs. Advised to use cabbage leaves on left breast 1-2 times per day today and tomorrow if needed to help reduce inflammation and over supply. Discussed reverse massage.  Reviewed S/S of mastitis and to call OB should they develop. When block feeding on left breast, pump right breast as needed to prevent engorgement on this breast.  Be sure not to miss any feedings. If breast fills again before baby ready to BF, then pump just to comfort and apply ice. Advised Mom to limit pumping but to find balance between BF, pumping so breast will find a balance and not over produce.  Mom to call for further questions/concerns.

## 2016-07-15 ENCOUNTER — Other Ambulatory Visit (HOSPITAL_COMMUNITY): Payer: Self-pay | Admitting: Obstetrics and Gynecology

## 2016-07-15 ENCOUNTER — Ambulatory Visit (HOSPITAL_COMMUNITY)
Admission: RE | Admit: 2016-07-15 | Discharge: 2016-07-15 | Disposition: A | Discharge status: Home / Self Care | Payer: BC Managed Care – PPO | Source: Ambulatory Visit | Attending: Obstetrics and Gynecology | Admitting: Obstetrics and Gynecology

## 2016-07-15 DIAGNOSIS — Q632 Ectopic kidney: Secondary | ICD-10-CM | POA: Insufficient documentation

## 2016-07-15 DIAGNOSIS — R932 Abnormal findings on diagnostic imaging of liver and biliary tract: Secondary | ICD-10-CM | POA: Insufficient documentation

## 2016-07-15 DIAGNOSIS — R1011 Right upper quadrant pain: Secondary | ICD-10-CM

## 2016-07-15 DIAGNOSIS — R112 Nausea with vomiting, unspecified: Secondary | ICD-10-CM | POA: Diagnosis present

## 2016-08-29 ENCOUNTER — Ambulatory Visit (HOSPITAL_COMMUNITY)
Admission: RE | Admit: 2016-08-29 | Discharge: 2016-08-29 | Disposition: A | Discharge status: Home / Self Care | Payer: BC Managed Care – PPO | Source: Ambulatory Visit | Attending: Obstetrics and Gynecology | Admitting: Obstetrics and Gynecology

## 2016-08-29 NOTE — Lactation Note (Signed)
Lactation Consult  Mother's reason for visit:  Sore nipples, overabundant milk supply Visit Type:  Outpatient feeding assessment Appointment Notes:  93 week old baby.  Overabundant milk supply. Mother has been block feeding. She is breastfeeding on one breast at a time and in approx 10 min baby consumed approx 4.2 oz.  Mother is pumping approx 2 times per day for comfort.  Recommend making sure she only pumps to comfort instead of long pumping sessions.  Mother's nipples are tender but mother states she can tolerate.  Nipples pink.  Oral assessment indicated cupped tongue with mid posterior tightness. Provided mother with education and tongue tie resources.  She can discuss w/ Peds MD.  Recommend trying to shorten feedings if baby is spitting large amount shortly after feeding and see if baby is satisfied.  Weight gain is sufficient. Mother also states baby spits up large amounts after bottle feeding. Recommend taking breaks during bottle feeding in order for baby to lead on volume.  Discussed sunflower lecithin with mother for plugged ducts.  Weight after feeding 13 lb 10.4 oz. Consult:  Follow-Up Lactation Consultant:  Carlye Grippe  ________________________________________________________________________ Sharene Skeans Name:  Nolberto Hanlon Date of Birth:  07/02/2016 Pediatrician:  Gaspar Bidding Gender:  female Gestational Age: [redacted]w[redacted]d (At Birth) Birth Weight:  8 lb 11.2 oz (3945 g) Weight at Discharge:  Weight: 8 lb 2.7 oz (3705 g)                                   Date of Discharge:  07/04/2016      Filed Weights   07/02/16 1616 07/03/16 0100 07/04/16 0026  Weight: 8 lb 11.2 oz (3945 g) 8 lb 8 oz (3855 g) 8 lb 2.7 oz (3705 g)  Last weight taken from location outside of [facility]:  9 lb        Location:Pediatrician's office Weight today:  13 lb 5.8 oz    ________________________________________________________________________  Mother's Name: Otilio Miu Breastfeeding  Experience:  P2 Maternal Medical Conditions:  Thyroid Maternal Medications:  Synthroid 137 mg daily  ________________________________________________________________________  Breastfeeding History (Post Discharge)  Frequency of breastfeeding:  q 3, longer at night Duration of feeding:  8-10 min    Pumping  Type of pump:  Medela pump in style Frequency:  1-2 times per day for comfort  Infant Intake and Output Assessment  Voids:  10+ in 24 hrs.  Color:  Clear yellow Stools:  10+ in 24 hrs.  Color:  Yellow  ________________________________________________________________________  Maternal Breast Assessment  Breast:  Filling Nipple:  Erect Pain level:  0 _______________________________________________________________________ Feeding Assessment/Evaluation  Initial feeding assessment:  Infant's oral assessment:  Variance  Positioning:  Cross cradle Right breast  LATCH documentation:  Latch:  2 = Grasps breast easily, tongue down, lips flanged, rhythmical sucking.  Audible swallowing:  2 = Spontaneous and intermittent  Type of nipple:  2 = Everted at rest and after stimulation  Comfort (Breast/Nipple):  1 = Filling, red/small blisters or bruises, mild/mod discomfort  Hold (Positioning):  2 = No assistance needed to correctly position infant at breast  LATCH score:  9  Attached assessment:  Deep  Lips flanged:  Yes.    Lips untucked:  No  Suck assessment:  Nutritive  Tools:  Pump Instructed on use and cleaning of tool:  Yes.    Additional Feeding Assessment -   Infant's oral assessment:  Variance  Positioning:  Cross cradle Left breast  LATCH documentation:  Latch:  2 = Grasps breast easily, tongue down, lips flanged, rhythmical sucking.  Audible swallowing:  2 = Spontaneous and intermittent  Type of nipple:  2 = Everted at rest and after stimulation  Comfort (Breast/Nipple):  1 = Filling, red/small blisters or bruises, mild/mod discomfort  Hold  (Positioning):  2 = No assistance needed to correctly position infant at breast  LATCH score:  9  Attached assessment:  Deep  Lips flanged:  Yes.    Lips untucked:  No.  Suck assessment:  Nutritive  Tools:  Pump Instructed on use and cleaning of tool:  Yes.    Pre-feed weight:  6062 g  Post-feed weight:  6190 g Amount transferred:  128 ml Amount supplemented:  0 ml    TTotal amount transferred:  128 ml Total supplement given:  0 ml

## 2017-03-26 IMAGING — US US PELVIS COMPLETE
1 series · 15 of 25 positions shown · non-contrast
Comparison: None.

CLINICAL DATA: Low abdominal pain for 2 days. Vaginal delivery on
07/02/2016. Normal white cell count.

EXAM:
TRANSABDOMINAL ULTRASOUND OF PELVIS
TECHNIQUE: Transabdominal ultrasound examination of the pelvis was performed
including evaluation of the uterus, ovaries, adnexal regions, and
pelvic cul-de-sac.

[Series 1: us pelvis complete · 15 of 43 slices shown]
[im 1/43]
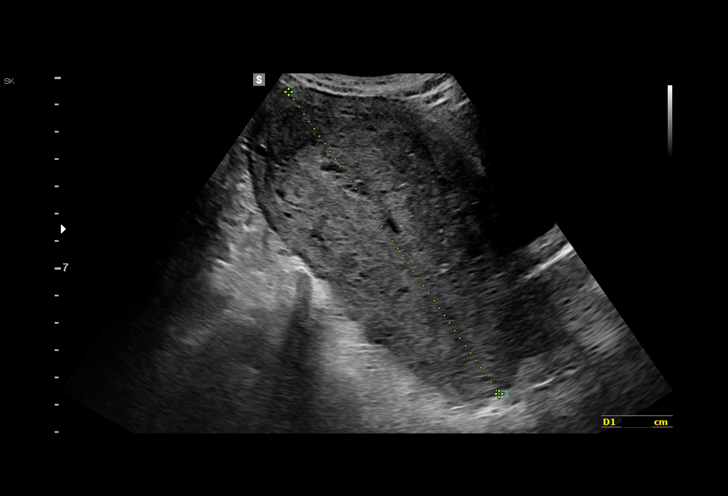
[im 4/43]
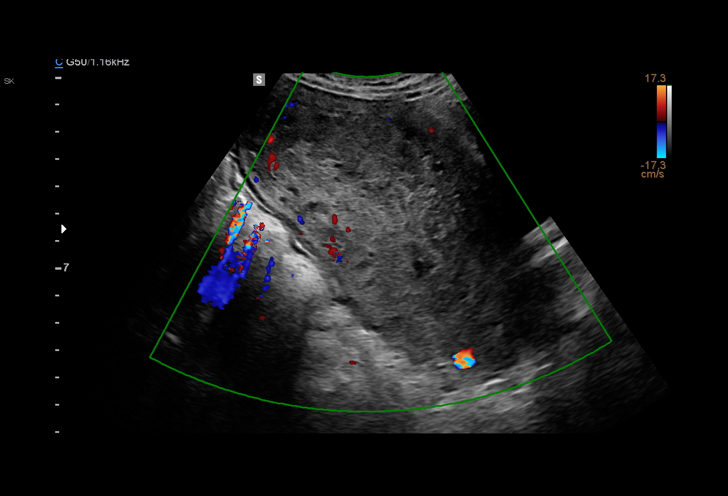
[im 8/43]
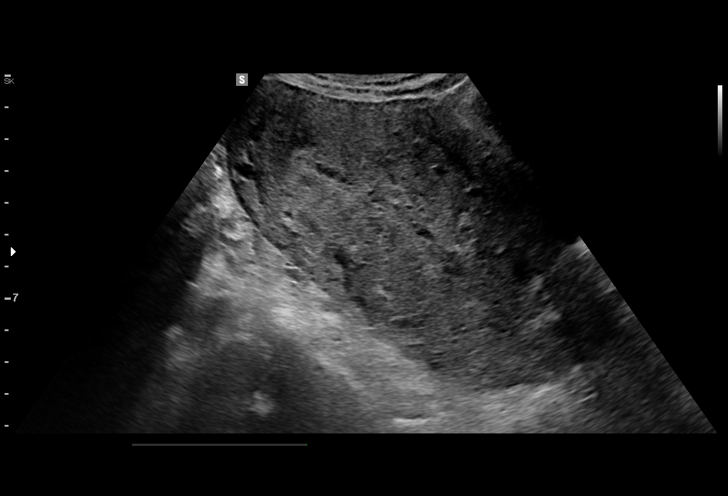
[im 9/43]
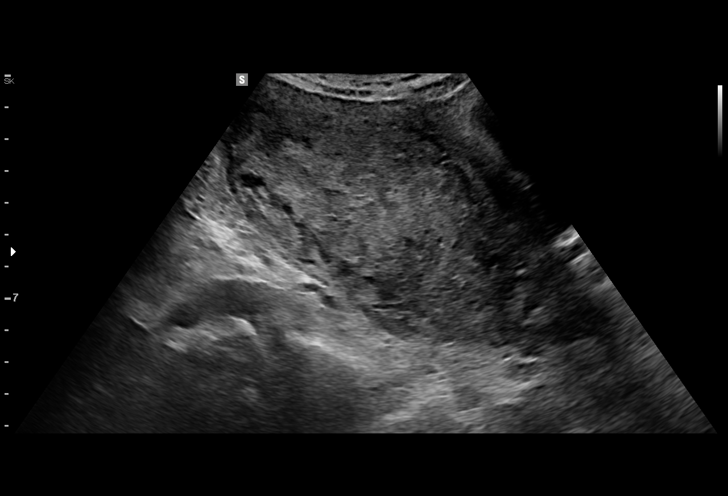
[im 13/43]
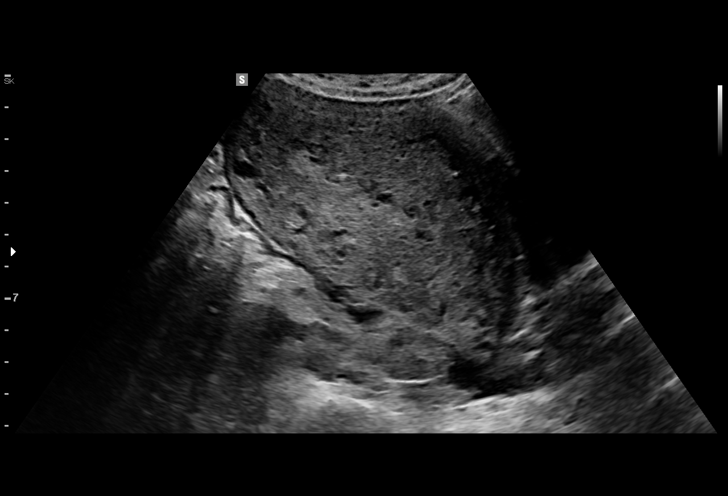
[im 16/43]
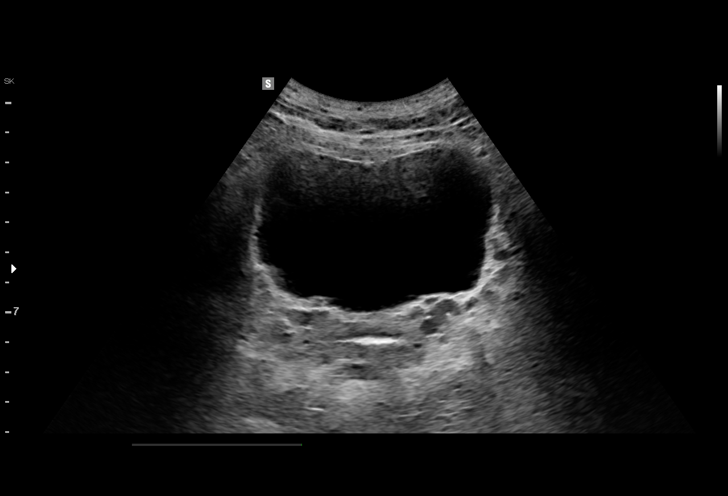
[im 18/43]
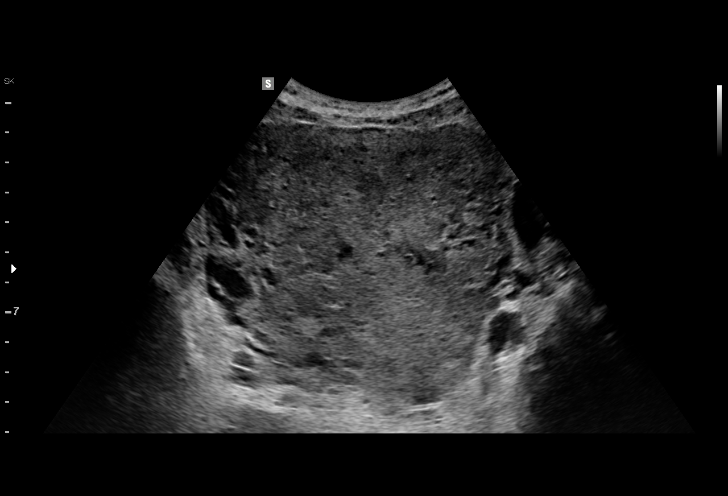
[im 22/43]
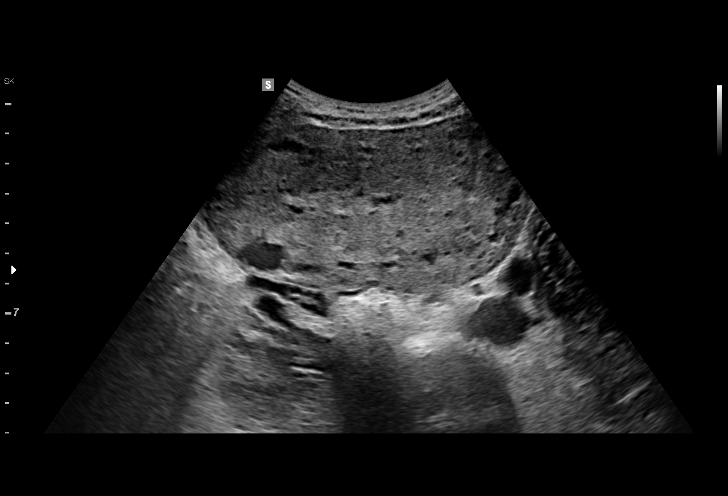
[im 25/43]
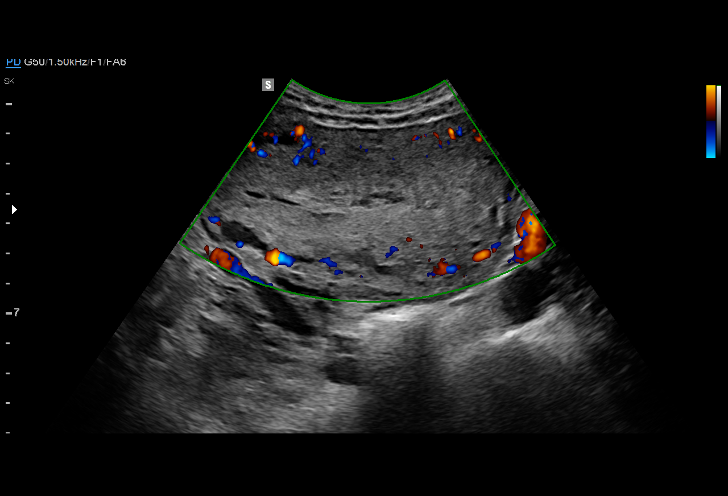
[im 27/43]
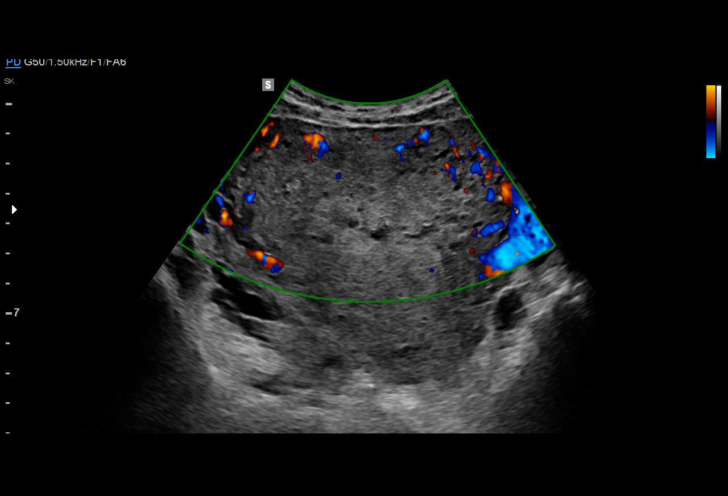
[im 30/43]
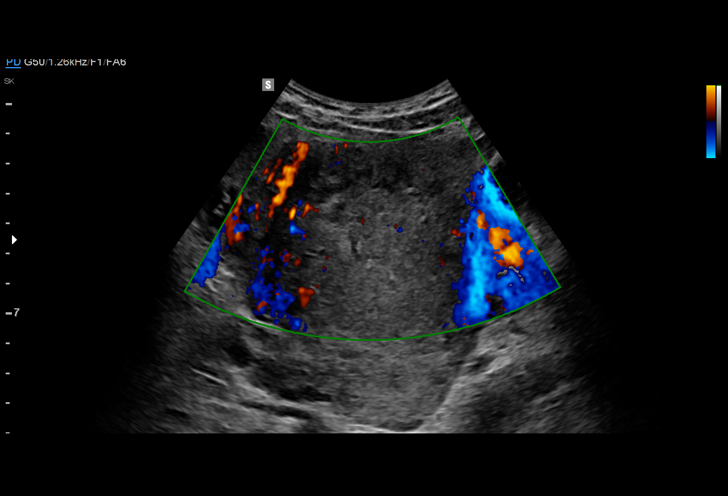
[im 34/43]
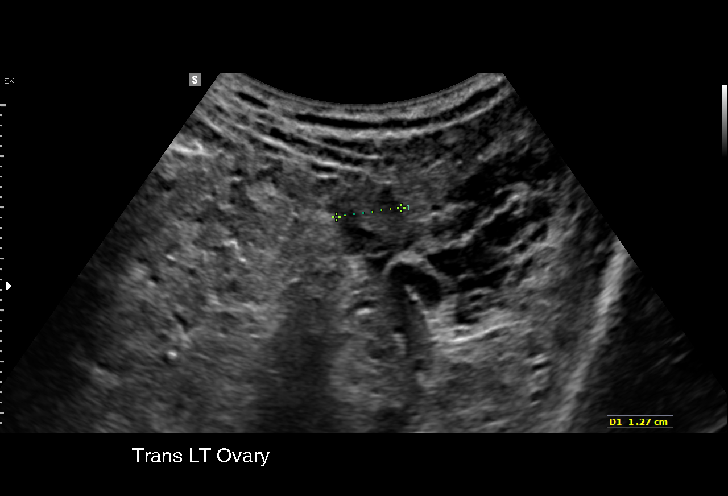
[im 36/43]
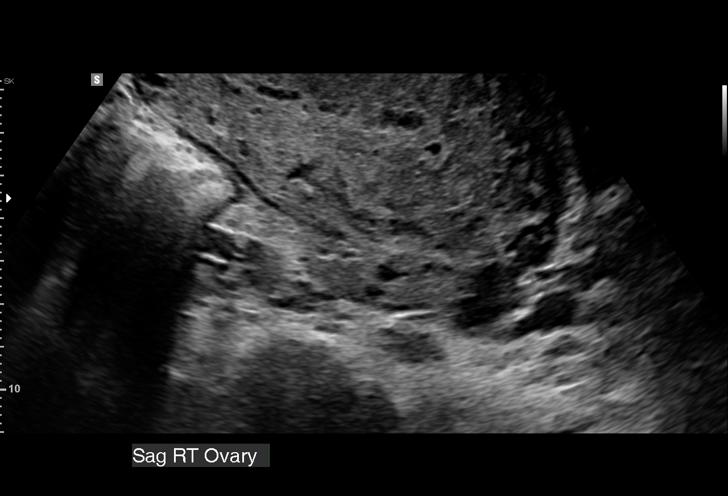
[im 39/43]
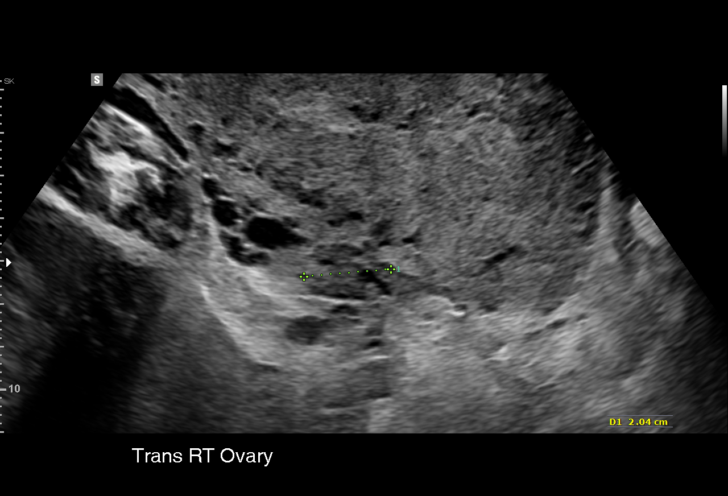
[im 43/43]
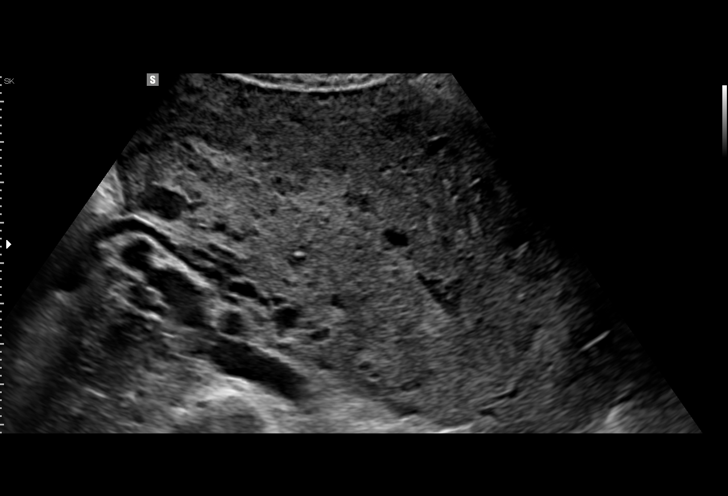

[15 of 25 positions shown; findings below may reference images not displayed]

FINDINGS: Uterus

Measurements: 13.5 x 6.9 x 9.5 cm. Uterus is anteverted and enlarged
consistent with postpartum state. Multiple venous lakes are
demonstrated. No fibroids or other mass visualized.

Endometrium

Thickness: 9 mm. Endometrium is not well defined. No definite
endometrial thickening or fluid. No flow demonstrated within the
endometrium on color flow Doppler imaging. No definitive evidence of
retained products of conception.

Right ovary

Measurements: 3.2 x 1.4 x 2 cm. Normal appearance/no adnexal mass.

Left ovary

Measurements: 2.6 x 1.2 x 1.3 cm. Normal appearance/no adnexal mass.

Other findings:  No abnormal free fluid.
IMPRESSION: Enlarged uterus consistent with postpartum state. Endometrial stripe
thickness is normal. No focal endometrial abnormalities are
demonstrated.

## 2018-07-14 IMAGING — US US ABDOMEN COMPLETE
1 series · 13 of 25 positions shown · non-contrast
Comparison: None.

CLINICAL DATA: One week history of upper abdominal pain with nausea
and vomiting

EXAM:
ABDOMEN ULTRASOUND COMPLETE

[Series 1: us abdomen complete · 0.18mm/px · 13 of 74 slices shown]
[im 1/74]
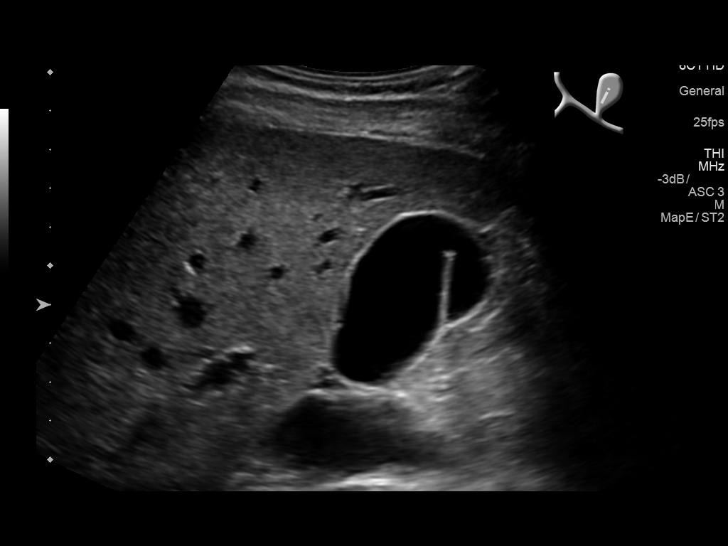
[im 7/74]
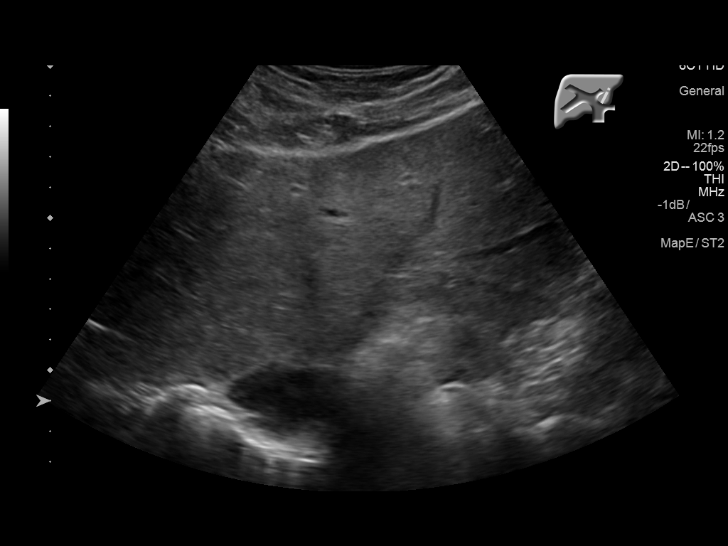
[im 13/74]
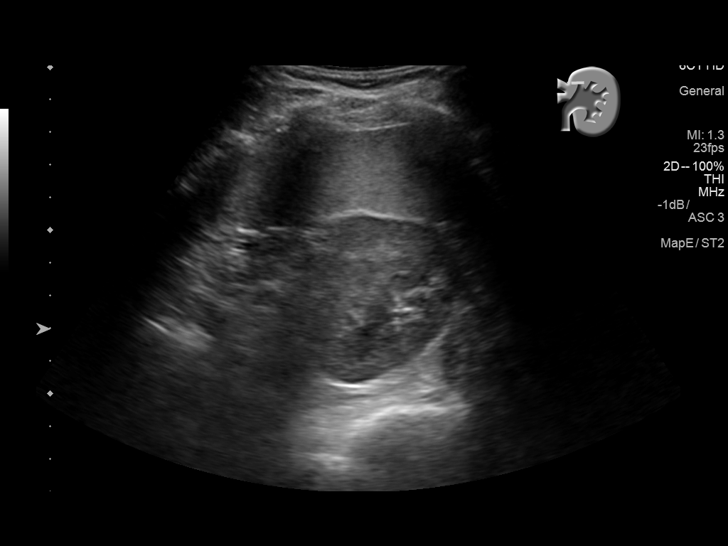
[im 19/74]
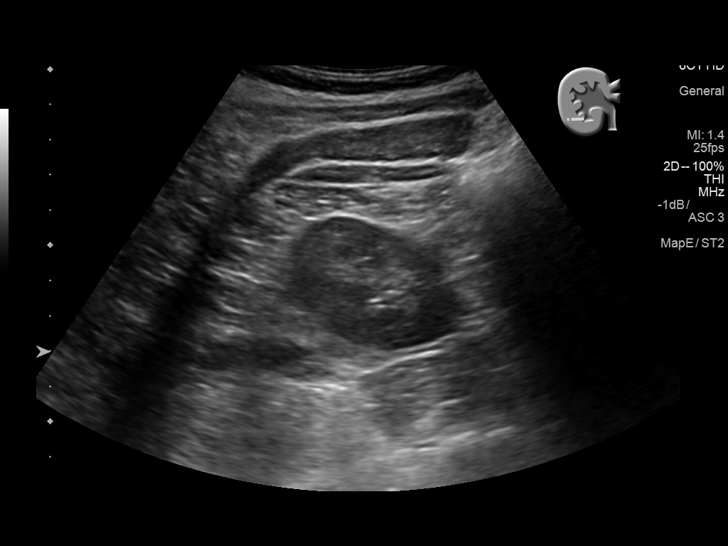
[im 25/74]
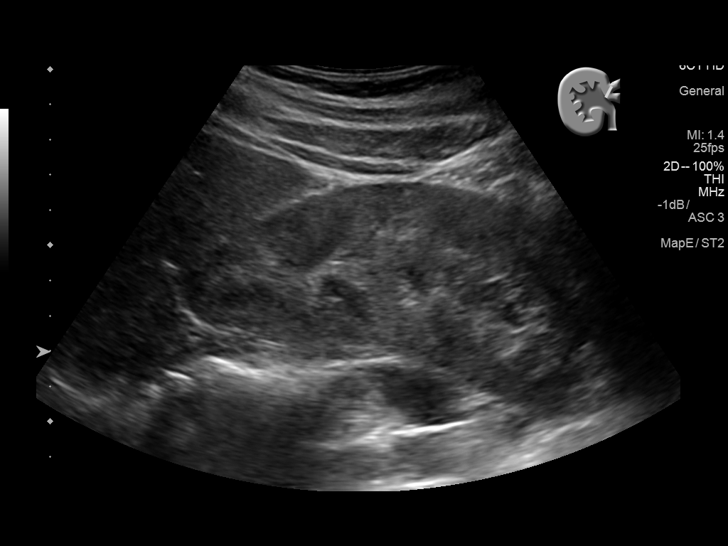
[im 31/74]
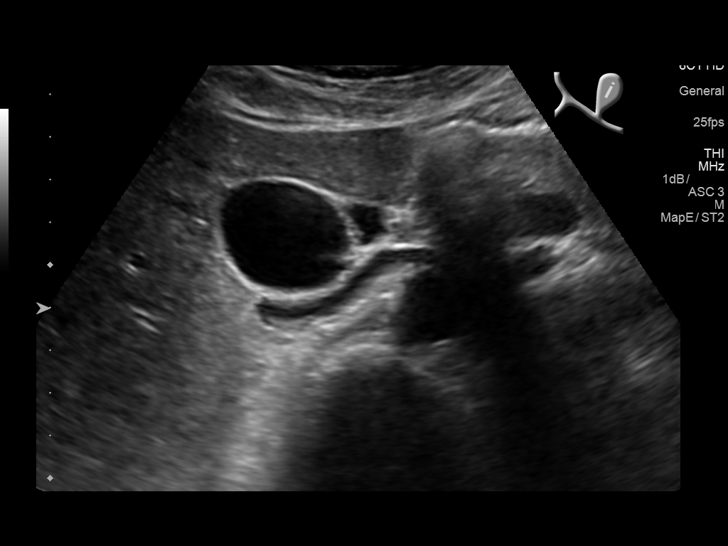
[im 37/74]
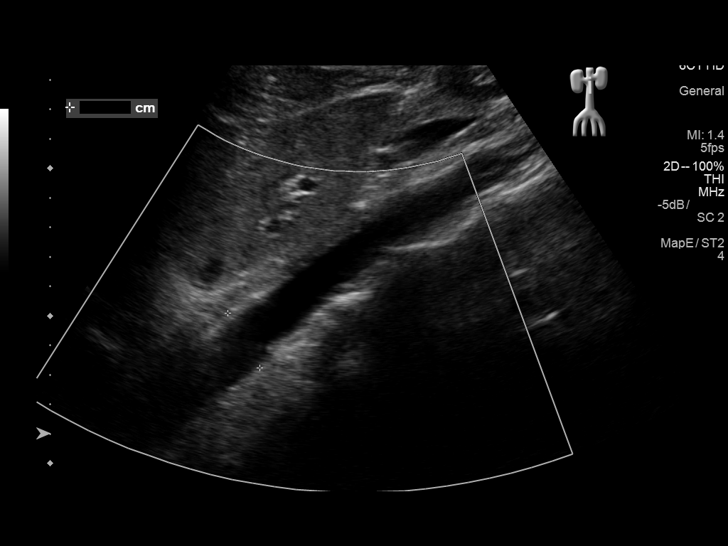
[im 43/74]
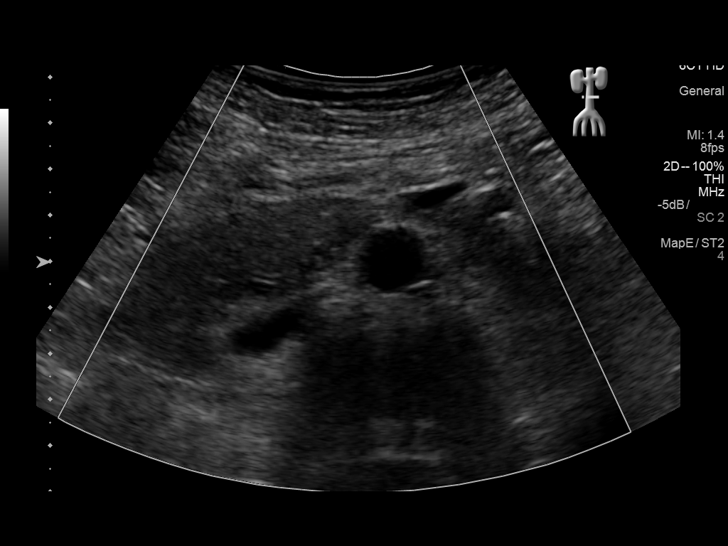
[im 49/74]
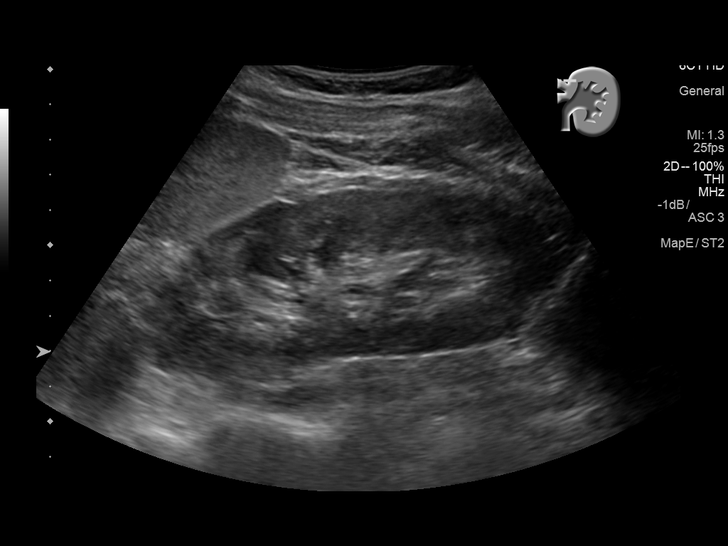
[im 55/74]
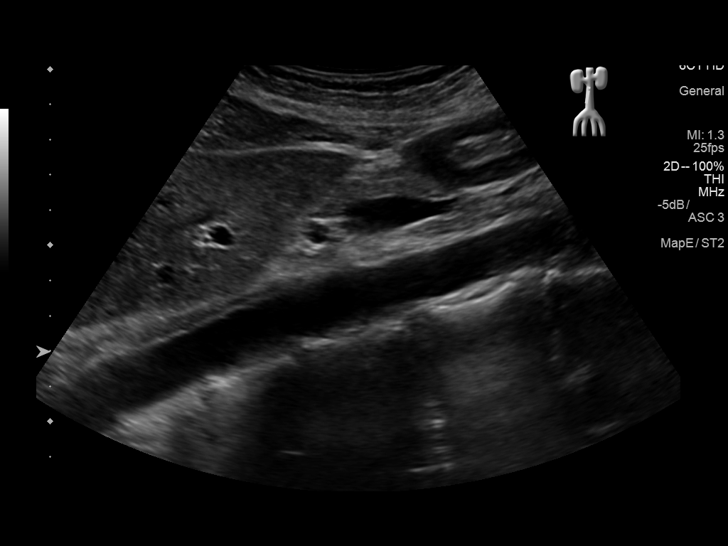
[im 61/74]
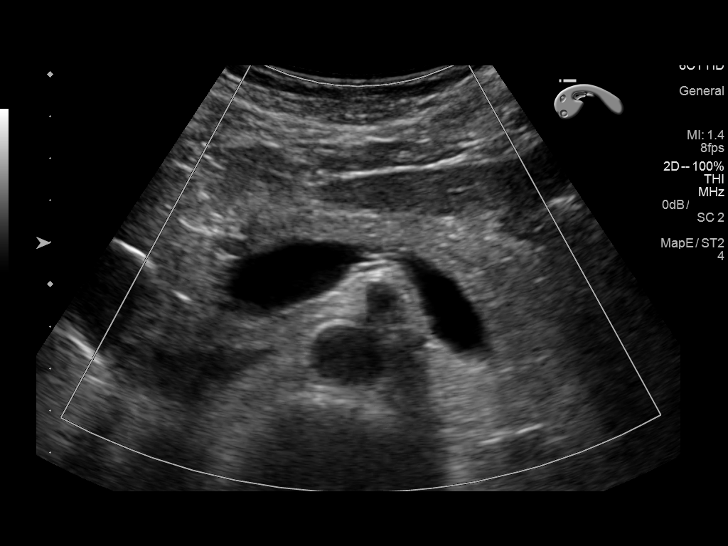
[im 67/74]
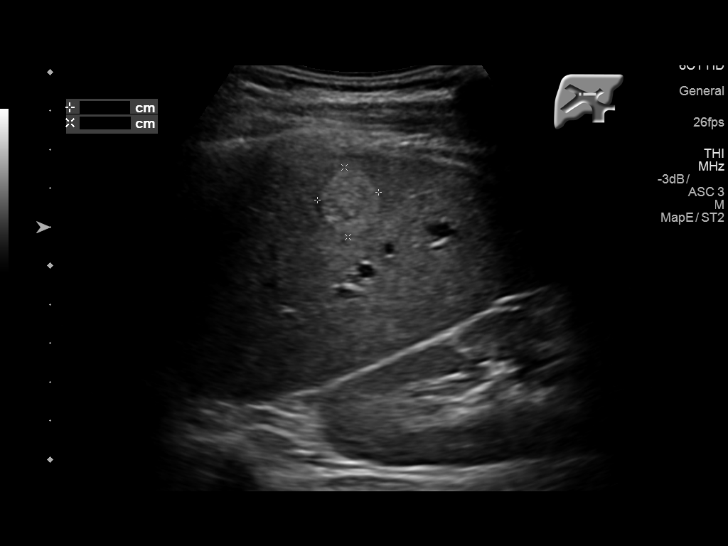
[im 74/74]
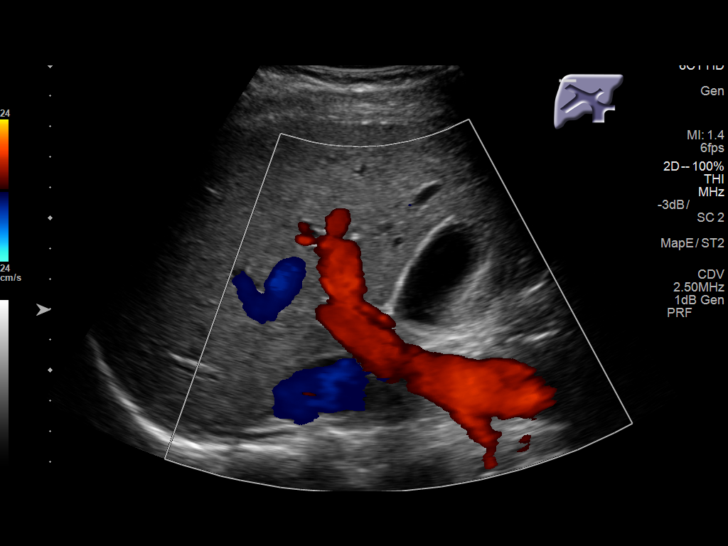

[13 of 25 positions shown; findings below may reference images not displayed]

FINDINGS: Gallbladder: No gallstones or wall thickening visualized. There is
no pericholecystic fluid. There is a septation within the
gallbladder, an anatomic variant. No sonographic Murphy sign noted
by sonographer.

Common bile duct: Diameter: 3 mm. There is no intrahepatic, common
hepatic, or common bile duct dilatation.

Liver: There is an echogenic focus in the anterior segment of the
right lobe of the liver measuring 1.6 x 1.8 x 1.6 cm. No other focal
liver lesions are identified. Within normal limits in parenchymal
echogenicity.

IVC: No abnormality visualized.

Pancreas: No mass or inflammatory focus.

Spleen: Size and appearance within normal limits.

Right Kidney: Length: 12.5 cm.. Echogenicity within normal limits.
No mass or hydronephrosis visualized. Right kidney appears somewhat
malrotated.

Left Kidney: Length: 11.5 cm. Echogenicity within normal limits. No
mass or hydronephrosis visualized.

Abdominal aorta: No aneurysm visualized.

Other findings: No demonstrable ascites.
IMPRESSION: Echogenic focus in the anterior segment of the right lobe of the
liver, a probable hemangioma. A follow-up study in 1 year to confirm
stability would be reasonable.

Malrotated right kidney.  No right kidney lesion.

Study otherwise unremarkable. There is a septation within the
gallbladder, an anatomic variant.

## 2022-04-20 ENCOUNTER — Other Ambulatory Visit: Payer: Self-pay

## 2022-04-20 ENCOUNTER — Emergency Department
Admission: EM | Admit: 2022-04-20 | Discharge: 2022-04-20 | Disposition: A | Discharge status: Home / Self Care | Payer: BC Managed Care – PPO | Source: Home / Self Care

## 2022-04-20 DIAGNOSIS — R002 Palpitations: Secondary | ICD-10-CM | POA: Diagnosis not present

## 2022-04-20 NOTE — ED Provider Notes (Signed)
?[facility] ? ? ? ?CSN: [id number] ?Arrival date & time: 04/20/22  1032 ? ? ?  ? ?History   ?Chief Complaint ?Chief Complaint  ?Patient presents with  ? Dizziness  ? Palpitations  ? ? ?HPI ?Kathryn Dunn is a 41 y.o. female.  ? ?HPI 41 year old female presents with heart palpitations, elevated heart rate, and dizziness for 1 hour.  Patient denies anginal equivalents, claudication, shortness of breath, presyncopal or syncopal episodes.  PMH significant for thyroid cancer (s/p thyroidectomy), hypothyroidism, septal defect, and ADHD.  Patient reports that she was told septal defect corrected itself when she was much younger.  Patient is accompanied by her daughter this morning. ? ?Past Medical History:  ?Diagnosis Date  ? ADHD (attention deficit hyperactivity disorder)   ? Complication of anesthesia   ? vomitting during surgery  ? Gestational diabetes   ? diet controlled  ? Hypothyroidism   ? Patient is a currently breast-feeding mother   ? Scoliosis   ? Septal defect , healed without surgery  ? not repaired  ? Thyroid cancer ([place])   ? thyroid removed  ? ? ?Patient Active Problem List  ? Diagnosis Date Noted  ? Cholestasis of pregnancy in third trimester 07/02/2016  ? NSVD (normal spontaneous vaginal delivery) 07/02/2016  ? Cellulitis of elbow 01/16/2015  ? HYPOTHYROIDISM 10/04/2009  ? ? ?Past Surgical History:  ?Procedure Laterality Date  ? THYROIDECTOMY    ? THYROIDECTOMY    ? WISDOM TOOTH EXTRACTION    ? ? ?OB History   ? ? Gravida  ?3  ? Para  ?3  ? Term  ?3  ? Preterm  ?0  ? AB  ?0  ? Living  ?3  ?  ? ? SAB  ?0  ? IAB  ?0  ? Ectopic  ?0  ? Multiple  ?0  ? Live Births  ?3  ?   ?  ?  ? ? ? ?Home Medications   ? ?Prior to Admission medications   ?Medication Sig Start Date End Date Taking? Authorizing Provider  ?azithromycin (ZITHROMAX) 250 MG tablet Take two tablets on the first day and then one tablet every day after. 04/15/22  Yes [provider]  ?dexmethylphenidate (FOCALIN) 10 MG tablet  dexmethylphenidate 10 mg tablet ? TAKE 1 TABLET BY MOUTH 2 TIMES DAILY 10/23/21  Yes [provider]  ?levothyroxine (SYNTHROID) 137 MCG tablet Take 1 tablet by mouth daily. 07/15/16  Yes [provider]  ?predniSONE (STERAPRED UNI-PAK 21 TAB) 10 MG (21) TBPK tablet Take by mouth. 04/15/22  Yes [provider]  ?docusate sodium (COLACE) 100 MG capsule Take 200 mg by mouth at bedtime.     [provider]  ?Ferrous Gluconate (IRON 27 PO) Take 1 tablet by mouth daily.    [provider]  ?ibuprofen (ADVIL,MOTRIN) 600 MG tablet Take 1 tablet (600 mg total) by mouth every 6 (six) hours as needed. 07/04/16   [name], DO  ?levothyroxine (SYNTHROID, LEVOTHROID) 137 MCG tablet Take 137 mcg by mouth daily before breakfast. Patient takes 1.5 tablets on Saturday only.    [provider]  ?oxyCODONE (OXY IR/ROXICODONE) 5 MG immediate release tablet Take 1 tablet (5 mg total) by mouth every 4 (four) hours as needed for severe pain (pain scale 4-7). 07/04/16   [name], DO  ?Prenatal Vit-Fe Fumarate-FA (PRENATAL MULTIVITAMIN) TABS Take 1 tablet by mouth at bedtime.     [provider]  ?ranitidine (ZANTAC) 150 MG  tablet Take 150 mg by mouth 2 (two) times daily as needed for heartburn.     [provider]  ? ? ?Family History ?Family History  ?Problem Relation Age of Onset  ? Diabetes Father   ? ADD / ADHD Father   ? Hypertension Father   ? ADD / ADHD Brother   ? Cancer Maternal Grandmother   ?     breast  ? Kidney disease Paternal Grandmother   ? Hypertension Paternal Grandfather   ? Thyroid cancer Maternal Uncle   ? Cancer Cousin   ?     colon  ? Mental retardation Cousin   ? ? ?Social History ?Social History  ? ?Tobacco Use  ? Smoking status: Never  ? Smokeless tobacco: Never  ?Vaping Use  ? Vaping Use: Never used  ?Substance Use Topics  ? Alcohol use: No  ? Drug use: No  ? ? ? ?Allergies   ?Patient has no known allergies. ? ? ?Review of  Systems ?Review of Systems  ?Cardiovascular:  Positive for palpitations.  ?Neurological:  Positive for dizziness.  ?All other systems reviewed and are negative. ? ? ?Physical Exam ?Triage Vital Signs ?ED Triage Vitals  ?Enc Vitals Group  ?   BP --   ?   Pulse --   ?   Resp --   ?   Temp --   ?   Temp src --   ?   SpO2 --   ?   Weight 04/20/22 1048 151 lb (68.5 kg)  ?   Height 04/20/22 1048 '5\' 4"'$  (1.626 m)  ?   Head Circumference --   ?   Peak Flow --   ?   Pain Score 04/20/22 1047 0  ?   Pain Loc --   ?   Pain Edu? --   ?   Excl. in [place]? --   ? ?No data found. ? ?Updated Vital Signs ?BP 117/81   Pulse 73   Temp 98.3 ?F (36.8 ?C) (Oral)   Resp 18   Ht '5\' 4"'$  (1.626 m)   Wt 151 lb (68.5 kg)   LMP 03/30/2022 (Approximate)   SpO2 97%   BMI 25.92 kg/m?  ? ? ?Physical Exam ?Vitals and nursing note reviewed.  ?Constitutional:   ?   Appearance: Normal appearance. She is normal weight.  ?HENT:  ?   Head: Normocephalic and atraumatic.  ?   Right Ear: Tympanic membrane, ear canal and external ear normal.  ?   Left Ear: Tympanic membrane, ear canal and external ear normal.  ?   Mouth/Throat:  ?   Mouth: Mucous membranes are moist.  ?   Pharynx: Oropharynx is clear.  ?Eyes:  ?   Extraocular Movements: Extraocular movements intact.  ?   Conjunctiva/sclera: Conjunctivae normal.  ?   Pupils: Pupils are equal, round, and reactive to light.  ?Neck:  ?   Comments: No JVD, no bruit ?Cardiovascular:  ?   Rate and Rhythm: Normal rate and regular rhythm.  ?   Pulses: Normal pulses.  ?   Heart sounds: Normal heart sounds. No murmur heard. ?  No friction rub. No gallop.  ?Pulmonary:  ?   Effort: Pulmonary effort is normal.  ?   Breath sounds: Normal breath sounds. No wheezing, rhonchi or rales.  ?Musculoskeletal:  ?   Cervical back: Normal range of motion and neck supple.  ?Skin: ?   General: Skin is warm and dry.  ?Neurological:  ?  General: No focal deficit present.  ?   Mental Status: She is alert and oriented to person, place,  and time. Mental status is at baseline.  ?   Cranial Nerves: No cranial nerve deficit.  ?   Sensory: No sensory deficit.  ?   Motor: No weakness.  ?   Coordination: Coordination normal.  ? ? ? ?UC Treatments / Results  ?Labs ?(all labs ordered are listed, but only abnormal results are displayed) ?Labs Reviewed - No data to display ? ?EKG ? ? ?Radiology ?No results found. ? ?Procedures ?Procedures (including critical care time) ? ?Medications Ordered in UC ?Medications - No data to display ? ?Initial Impression / Assessment and Plan / UC Course  ?I have reviewed the triage vital signs and the nursing notes. ? ?Pertinent labs & imaging results that were available during my care of the patient were reviewed by me and considered in my medical decision making (see chart for details). ? ?  ? ?MDM: 1. Palpitations-EKG revealed NSR with left axis deviation. Advised patient EKG was within normal limits.  Advised patient if symptoms worsen and/or unresolved please follow-up with PCP or here for further evaluation.  Patient discharged home, hemodynamically stable.  ?Final Clinical Impressions(s) / UC Diagnoses  ? ?Final diagnoses:  ?Palpitations  ? ? ? ?Discharge Instructions   ? ?  ?Advised patient EKG was within normal limits.  Advised patient if symptoms worsen and/or unresolved please follow-up with PCP or here for further evaluation. ? ? ? ? ?ED Prescriptions   ?None ?  ? ?PDMP not reviewed this encounter. ?  ?Eliezer Lofts, [place] ?04/20/22 1131 ? ?

## 2022-04-20 NOTE — Discharge Instructions (Addendum)
Advised patient EKG was within normal limits.  Advised patient if symptoms worsen and/or unresolved please follow-up with PCP or here for further evaluation. ?

## 2022-04-20 NOTE — ED Triage Notes (Addendum)
Pt presents to Urgent Care with c/o heart palpitations/high HR and dizziness x 1 hour. Reports that her HR is normally in the 60s and it was 80 bpm earlier. She states she felt like her BP was low. Denies pain, sob, nausea.  ?
